# Patient Record
Sex: Male | Born: 1985 | Race: Black or African American | Hispanic: No | Marital: Married | Smoking: Current every day smoker
Health system: Southern US, Community
[De-identification: ages and names within clinical notes are randomized; demographics above are authoritative.]

## PROBLEM LIST (undated history)

## (undated) DIAGNOSIS — I10 Essential (primary) hypertension: Principal | ICD-10-CM

## (undated) DIAGNOSIS — K649 Unspecified hemorrhoids: Secondary | ICD-10-CM

## (undated) DIAGNOSIS — K219 Gastro-esophageal reflux disease without esophagitis: Secondary | ICD-10-CM

## (undated) HISTORY — DX: Gastro-esophageal reflux disease without esophagitis: K21.9

## (undated) HISTORY — PX: HEMORRHOID BANDING: SHX5850

## (undated) HISTORY — DX: Essential (primary) hypertension: I10

## (undated) HISTORY — DX: Unspecified hemorrhoids: K64.9

---

## 2009-10-13 ENCOUNTER — Emergency Department (HOSPITAL_COMMUNITY): Admission: EM | Admit: 2009-10-13 | Discharge: 2009-10-14 | Payer: Self-pay | Admitting: Emergency Medicine

## 2010-06-24 ENCOUNTER — Emergency Department (HOSPITAL_COMMUNITY)
Admission: EM | Admit: 2010-06-24 | Discharge: 2010-06-24 | Disposition: A | Discharge status: Home / Self Care | Payer: Medicaid Other | Attending: Emergency Medicine | Admitting: Emergency Medicine

## 2010-06-24 ENCOUNTER — Emergency Department (HOSPITAL_COMMUNITY): Payer: Medicaid Other

## 2010-06-24 DIAGNOSIS — K219 Gastro-esophageal reflux disease without esophagitis: Secondary | ICD-10-CM | POA: Insufficient documentation

## 2010-06-24 DIAGNOSIS — R072 Precordial pain: Secondary | ICD-10-CM | POA: Insufficient documentation

## 2010-06-24 LAB — POCT I-STAT, CHEM 8
BUN: 14 mg/dL (ref 6–23)
Calcium, Ion: 1.26 mmol/L (ref 1.12–1.32)
Chloride: 104 mEq/L (ref 96–112)
Creatinine, Ser: 1.2 mg/dL (ref 0.4–1.5)
Glucose, Bld: 89 mg/dL (ref 70–99)
HCT: 46 % (ref 39.0–52.0)
Hemoglobin: 15.6 g/dL (ref 13.0–17.0)
Potassium: 4.5 mEq/L (ref 3.5–5.1)
Sodium: 140 mEq/L (ref 135–145)
TCO2: 27 mmol/L (ref 0–100)

## 2010-06-24 LAB — POCT CARDIAC MARKERS
CKMB, poc: <1 ng/mL — ABNORMAL LOW (ref 1.0–8.0)
Myoglobin, poc: 42.5 ng/mL (ref 12–200)
Troponin i, poc: <0.05 ng/mL (ref 0.00–0.09)

## 2010-06-24 LAB — DIFFERENTIAL
Basophils Absolute: 0 10*3/uL (ref 0.0–0.1)
Basophils Relative: 1 % (ref 0–1)
Eosinophils Absolute: 0.2 10*3/uL (ref 0.0–0.7)
Eosinophils Relative: 3 % (ref 0–5)
Lymphocytes Relative: 51 % — ABNORMAL HIGH (ref 12–46)
Lymphs Abs: 3 10*3/uL (ref 0.7–4.0)
Monocytes Absolute: 0.4 10*3/uL (ref 0.1–1.0)
Monocytes Relative: 6 % (ref 3–12)
Neutro Abs: 2.3 10*3/uL (ref 1.7–7.7)
Neutrophils Relative %: 39 % — ABNORMAL LOW (ref 43–77)

## 2010-06-24 LAB — RAPID URINE DRUG SCREEN, HOSP PERFORMED
Amphetamines: NOT DETECTED
Barbiturates: NOT DETECTED
Benzodiazepines: NOT DETECTED
Cocaine: NOT DETECTED
Opiates: NOT DETECTED
Tetrahydrocannabinol: POSITIVE — AB

## 2010-06-24 LAB — CBC
HCT: 43.9 % (ref 39.0–52.0)
Hemoglobin: 15.2 g/dL (ref 13.0–17.0)
MCH: 28.1 pg (ref 26.0–34.0)
MCHC: 34.6 g/dL (ref 30.0–36.0)
MCV: 81.3 fL (ref 78.0–100.0)
Platelets: 188 10*3/uL (ref 150–400)
RBC: 5.4 MIL/uL (ref 4.22–5.81)
RDW: 13.6 % (ref 11.5–15.5)
WBC: 5.8 10*3/uL (ref 4.0–10.5)

## 2014-11-02 ENCOUNTER — Ambulatory Visit (INDEPENDENT_AMBULATORY_CARE_PROVIDER_SITE_OTHER): Payer: BLUE CROSS/BLUE SHIELD | Admitting: Physician Assistant

## 2014-11-02 VITALS — BP 140/72 | HR 100 | Temp 99.0°F | Ht 74.0 in | Wt 156.5 lb

## 2014-11-02 DIAGNOSIS — R369 Urethral discharge, unspecified: Secondary | ICD-10-CM

## 2014-11-02 DIAGNOSIS — IMO0001 Reserved for inherently not codable concepts without codable children: Secondary | ICD-10-CM

## 2014-11-02 DIAGNOSIS — R030 Elevated blood-pressure reading, without diagnosis of hypertension: Secondary | ICD-10-CM

## 2014-11-02 LAB — POCT URINALYSIS DIPSTICK
Bilirubin, UA: NEGATIVE
Glucose, UA: NEGATIVE
Ketones, UA: NEGATIVE
Leukocytes, UA: NEGATIVE
Nitrite, UA: NEGATIVE
Protein, UA: NEGATIVE
Spec Grav, UA: 1.015
Urobilinogen, UA: 0.2
pH, UA: 6

## 2014-11-02 LAB — POCT UA - MICROSCOPIC ONLY
Casts, Ur, LPF, POC: NEGATIVE
Crystals, Ur, HPF, POC: NEGATIVE
Mucus, UA: NEGATIVE
Yeast, UA: NEGATIVE

## 2014-11-02 MED ORDER — AZITHROMYCIN 500 MG PO TABS: ORAL_TABLET | ORAL | Status: DC

## 2014-11-02 MED ORDER — CEFTRIAXONE SODIUM 1 G IJ SOLR
250.0000 mg | Freq: Once | INTRAMUSCULAR | Status: AC
  Administered 2014-11-02: 250 mg via INTRAMUSCULAR
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
## 2014-11-02 NOTE — Patient Instructions (Signed)
Take 2 tabs of azithromycin at once. Have your partner get tested and treated. No intercourse for 2 weeks. I will call you with your lab tests. Return if symptoms do not improve in 1 week.

## 2014-11-03 ENCOUNTER — Telehealth: Payer: Self-pay

## 2014-11-03 ENCOUNTER — Other Ambulatory Visit: Payer: Self-pay | Admitting: Physician Assistant

## 2014-11-04 ENCOUNTER — Telehealth: Payer: Self-pay

## 2014-11-04 LAB — GC/CHLAMYDIA PROBE AMP
CT Probe RNA: NEGATIVE
GC Probe RNA: NEGATIVE

## 2014-11-04 NOTE — Telephone Encounter (Signed)
[facility] a lab order for micro plasma and they no longer do it.

## 2014-11-05 ENCOUNTER — Telehealth: Payer: Self-pay | Admitting: Family Medicine

## 2014-11-05 LAB — TRICHOMONAS VAGINALIS, PROBE AMP: T vaginalis RNA: NEGATIVE

## 2014-11-05 NOTE — Telephone Encounter (Signed)
Patient called about labs i advised him that he didn't have STD plus you haven't looked at labs yet

## 2014-11-08 ENCOUNTER — Telehealth: Payer: Self-pay | Admitting: Family Medicine

## 2014-11-08 DIAGNOSIS — R369 Urethral discharge, unspecified: Secondary | ICD-10-CM

## 2014-11-08 MED ORDER — AZITHROMYCIN 500 MG PO TABS: ORAL_TABLET | ORAL | Status: DC

## 2014-11-08 NOTE — Telephone Encounter (Signed)
Left VM letting him know lab results negative. If he has questions, he can call back.

## 2014-11-08 NOTE — Telephone Encounter (Signed)
Spoke with patient gave him his results again he states that hes still having discharge but he told me he threw medicine up i told him that's the reason why so Jordan Walker could you please seen patient in another RX

## 2014-11-08 NOTE — Addendum Note (Signed)
Addended by: Dorna LeitzBUSH, NICOLE V on: 11/08/2014 02:36 PM   Modules accepted: Orders

## 2014-11-08 NOTE — Telephone Encounter (Addendum)
Rx sent to pharmacy since he threw up pills 20 minutes after taking pills. Still with some penile irritation. No longer having discharge. Return if symptoms persist.

## 2014-11-22 ENCOUNTER — Ambulatory Visit: Payer: Self-pay | Admitting: Family

## 2015-05-03 ENCOUNTER — Ambulatory Visit (INDEPENDENT_AMBULATORY_CARE_PROVIDER_SITE_OTHER): Payer: BLUE CROSS/BLUE SHIELD

## 2015-05-03 ENCOUNTER — Ambulatory Visit (INDEPENDENT_AMBULATORY_CARE_PROVIDER_SITE_OTHER): Payer: BLUE CROSS/BLUE SHIELD | Admitting: Family Medicine

## 2015-05-03 VITALS — BP 132/70 | HR 85 | Temp 98.9°F | Resp 20 | Ht 75.0 in | Wt 156.6 lb

## 2015-05-03 DIAGNOSIS — M545 Low back pain, unspecified: Secondary | ICD-10-CM

## 2015-05-03 DIAGNOSIS — M79604 Pain in right leg: Secondary | ICD-10-CM | POA: Diagnosis not present

## 2015-05-03 DIAGNOSIS — T148XXA Other injury of unspecified body region, initial encounter: Secondary | ICD-10-CM

## 2015-05-03 DIAGNOSIS — M4120 Other idiopathic scoliosis, site unspecified: Secondary | ICD-10-CM | POA: Diagnosis not present

## 2015-05-03 MED ORDER — NAPROXEN 500 MG PO TABS: 500.0000 mg | ORAL_TABLET | Freq: Two times a day (BID) | ORAL | Status: DC

## 2015-05-03 MED ORDER — CYCLOBENZAPRINE HCL 10 MG PO TABS: ORAL_TABLET | ORAL | Status: DC
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
## 2015-05-03 NOTE — Patient Instructions (Signed)
Acute Compartment Syndrome Compartment syndrome is a painful condition that occurs when swelling and pressure build up in a body space (compartment) of the arms or legs. Groups of muscles, nerves, and blood vessels in the arms and legs are separated into various compartments. Each compartment is surrounded by tough layers of tissue called fascia. In compartment syndrome, pressure builds up within the layers of fascia and begins to push on the structures within that compartment.  In acute compartment syndrome, the pressure builds up suddenly, often as the result of an injury. This is a surgical emergency. When a muscle in the compartment moves, you may feel severe pain. If pressure continues to increase, it can block the flow of blood in the smallest blood vessels (capillaries). Then, the nerves and muscles in the compartment cannot get enough oxygen and nutrients (substances needed for survival). They will start to die within 4-8 hours. That is why the pressure needs to be relieved immediately. Identifying the condition early and treating it quickly can prevent most problems. CAUSES  Various things can lead to compartment syndrome. Possible causes include:   Injury. Some injuries can cause swelling or bleeding in a compartment. This can lead to compartment syndrome. Injuries that may cause this problem include:  Broken bones, especially the long bones of the arms and legs.  Crushing injuries.  Penetrating injuries, such as a knife wound that punctures the skin and tissue underneath.  Badly bruised muscles.  Poisonous bites, such as a snake bite.  Severe burns.  Blocked blood flow. This could result from:  A cast or bandage that is too tight.  A surgical procedure. Blood flow sometimes has to be stopped for a while during a surgery, usually with a tourniquet.  Lying for too long in a position that restricts blood flow. This can happen in people who have nerve damage or if a person is  unconscious for a long time.  Drugs used to build up muscles (anabolic steroids).  Drugs that keep the blood from forming clots (blood thinners). SIGNS AND SYMPTOMS  The most common symptom of compartment syndrome is pain. The pain may:   Get worse when moving or stretching the affected body part.  Be more severe than it should be for an injury.  Come along with a feeling of tingling or burning.  Become worse when the area is pushed or squeezed.  Be unaffected by pain medicine. Other symptoms include:   A feeling of tightness or fullness in the affected area.   A loss of feeling.  Weakness in the area.  Loss of movement.  Skin becoming pale, tight, and shiny over the painful area.  DIAGNOSIS  Your health care provider may suspect the problem based on how you describe the pain. The diagnosis is made by using a special device that measures the pressure in the affected area. Blood tests, X-rays, or an ultrasound exam may be done to help rule out other problems.  TREATMENT  Compartment syndrome is a surgical emergency. It should be treated very quickly.   First-aid treatment is given first. This may include:  Promptly treating an injury.  Loosening or removing any cast, bandage, or external wrap that may be causing pain.  Raising the painful arm or leg to the same level as the heart.  Giving oxygen.  Giving fluid through an IV access tube that is put into a vein in the hand or arm.  Surgery (fasciotomy) is needed to relieve the pressure and help prevent permanent  damage. In this surgery, cuts (incisions) are made through the fascia to relieve the pressure in the compartment.   This information is not intended to replace advice given to you by your health care provider. Make sure you discuss any questions you have with your health care provider.   Document Released: 03/28/2009 Document Revised: 12/10/2012 Document Reviewed: 11/11/2012 Elsevier Interactive Patient  Education [employer]2016 Elsevier Inc.

## 2016-04-10 ENCOUNTER — Encounter: Payer: Self-pay | Admitting: Family

## 2016-04-10 ENCOUNTER — Ambulatory Visit (INDEPENDENT_AMBULATORY_CARE_PROVIDER_SITE_OTHER): Payer: BLUE CROSS/BLUE SHIELD | Admitting: Family

## 2016-04-10 VITALS — BP 128/90 | HR 75 | Temp 98.1°F | Resp 16 | Ht 75.0 in | Wt 145.0 lb

## 2016-04-10 DIAGNOSIS — Z23 Encounter for immunization: Secondary | ICD-10-CM | POA: Diagnosis not present

## 2016-04-10 DIAGNOSIS — Z0000 Encounter for general adult medical examination without abnormal findings: Secondary | ICD-10-CM

## 2016-04-10 NOTE — Progress Notes (Signed)
Subjective:    Patient ID: Jordan Walker, male    DOB: 1985-05-18, 30 y.o.   MRN: [id number]  Chief Complaint  Patient presents with  . CPE    not fasting    HPI:  Jordan Walker is a [age] y.o. male who presents today for an annual wellness visit.   1) Health Maintenance -   Diet - Averages about 2-3 meals per day consisting of a regular diet; Caffeine intake of 2-3 cups per day  Exercise - No structured exercise   2) Preventative Exams / Immunizations:  Dental -- Due for exam  Vision -- Due for exam   Health Maintenance  Topic Date Due  . HIV Screening  08/10/1998  . TETANUS/TDAP  08/10/2002  . INFLUENZA VACCINE  07/21/2016 (Originally 11/22/2015)    Immunization History  Administered Date(s) Administered  . Tdap 04/10/2016     No Known Allergies   Outpatient Medications Prior to Visit  Medication Sig Dispense Refill  . cyclobenzaprine (FLEXERIL) 10 MG tablet 1/2-1 tab po BID prn 30 tablet 0  . naproxen (NAPROSYN) 500 MG tablet Take 1 tablet (500 mg total) by mouth 2 (two) times daily with a meal. Take with food, No NSAIDs 30 tablet 0   No facility-administered medications prior to visit.      Past Medical History:  Diagnosis Date  . Hemorrhoids      Past Surgical History:  Procedure Laterality Date  . HEMORRHOID BANDING       Family History  Problem Relation Age of Onset  . Diabetes Mother   . Hypertension Sister   . Hypertension Maternal Grandmother      Social History   Social History  . Marital status: Married    Spouse name: N/A  . Number of children: 3  . Years of education: 5013   Occupational History  . Camera operatorAssistant Associate    Social History Main Topics  . Smoking status: Current Every Day Smoker    Packs/day: 2.00    Years: 12.00    Types: Cigars, Cigarettes  . Smokeless tobacco: Never Used  . Alcohol use 0.6 oz/week    1 Cans of beer per week     Comment: Rarely  . Drug use:     Frequency: 7.0 times per week    Types:  Marijuana  . Sexual activity: Not on file   Other Topics Concern  . Not on file   Social History Narrative  . No narrative on file    Review of Systems  Constitutional: Denies fever, chills, fatigue, or significant weight gain/loss. HENT: Head: Denies headache or neck pain Ears: Denies changes in hearing, ringing in ears, earache, drainage Nose: Denies discharge, stuffiness, itching, nosebleed, sinus pain Throat: Denies sore throat, hoarseness, dry mouth, sores, thrush Eyes: Denies loss/changes in vision, pain, redness, blurry/double vision, flashing lights Cardiovascular: Denies chest pain/discomfort, tightness, palpitations, shortness of breath with activity, difficulty lying down, swelling, sudden awakening with shortness of breath Respiratory: Denies shortness of breath, cough, sputum production, wheezing Gastrointestinal: Denies dysphasia, heartburn, change in appetite, nausea, change in bowel habits, rectal bleeding, constipation, diarrhea, yellow skin or eyes Genitourinary: Denies frequency, urgency, burning/pain, blood in urine, incontinence, change in urinary strength. Musculoskeletal: Denies muscle/joint pain, stiffness, back pain, redness or swelling of joints, trauma Skin: Denies rashes, lumps, itching, dryness, color changes, or hair/nail changes Neurological: Denies dizziness, fainting, seizures, weakness, numbness, tingling, tremor Psychiatric - Denies nervousness, stress, depression or memory loss Endocrine: Denies heat or cold intolerance, sweating,  frequent urination, excessive thirst, changes in appetite Hematologic: Denies ease of bruising or bleeding     Objective:     BP 128/90 (BP Location: Left Arm, Patient Position: Sitting, Cuff Size: Normal)   Pulse 75   Temp 98.1 F (36.7 C) (Oral)   Resp 16   Ht 6\' 3"  (1.905 m)   Wt 145 lb (65.8 kg)   SpO2 98%   BMI 18.12 kg/m  Nursing note and vital signs reviewed.  Physical Exam  Constitutional: He is  oriented to person, place, and time. He appears well-developed and well-nourished.  HENT:  Head: Normocephalic.  Right Ear: Hearing, tympanic membrane, external ear and ear canal normal.  Left Ear: Hearing, tympanic membrane, external ear and ear canal normal.  Nose: Nose normal.  Mouth/Throat: Uvula is midline, oropharynx is clear and moist and mucous membranes are normal.  Eyes: Conjunctivae and EOM are normal. Pupils are equal, round, and reactive to light.  Neck: Neck supple. No JVD present. No tracheal deviation present. No thyromegaly present.  Cardiovascular: Normal rate, regular rhythm, normal heart sounds and intact distal pulses.   Pulmonary/Chest: Effort normal and breath sounds normal.  Abdominal: Soft. Bowel sounds are normal. He exhibits no distension and no mass. There is no tenderness. There is no rebound and no guarding.  Musculoskeletal: Normal range of motion. He exhibits no edema or tenderness.  Lymphadenopathy:    He has no cervical adenopathy.  Neurological: He is alert and oriented to person, place, and time. He has normal reflexes. No cranial nerve deficit. He exhibits normal muscle tone. Coordination normal.  Skin: Skin is warm and dry.  Psychiatric: He has a normal mood and affect. His behavior is normal. Judgment and thought content normal.       Assessment & Plan:   Problem List Items Addressed This Visit      Other   Routine general medical examination at a health care facility    1) Anticipatory Guidance: Discussed importance of wearing a seatbelt while driving and not texting while driving; changing batteries in smoke detector at least once annually; wearing suntan lotion when outside; eating a balanced and moderate diet; getting physical activity at least 30 minutes per day.  2) Immunizations / Screenings / Labs:  Tetanus updated today. Declines influenza. All other immunizations are up-to-date per recommendations. Due for dental and vision screen  encouraged to be completed independently. All other screenings are up-to-date per recommendations. Obtain CBC, A1c, TSH, CMET, and lipid profile.    Overall well exam with risk factors for cardiovascular disease including tobacco use and sedentary lifestyle. Discussed importance of tobacco cessation for reduce risk of cardiovascular, respiratory, and malignant diseases in the future. Indicates he is considering tobacco cessation with information provided and after visit summary. Recommend increasing physical activity to 30 minutes of moderate level activity daily. He is of good weight. He does have several concerns as he has pains in various aspects of his joints on occasion and is worried for chronic disease. Continue other healthy last behaviors and choices. Follow-up prevention exam in 1 year. Follow-up office visit pending blood work as needed.      Relevant Orders   CBC   Comprehensive metabolic panel   TSH   Lipid panel   Hemoglobin A1c    Other Visit Diagnoses    Need for diphtheria-tetanus-pertussis (Tdap) vaccine    -  Primary   Relevant Orders   Tdap vaccine greater than or equal to 7yo IM (Completed)  I have discontinued Mr. Murdoch cyclobenzaprine and naproxen.   Follow-up: Return if symptoms worsen or fail to improve.   Jeanine Luz, FNP

## 2016-04-10 NOTE — Patient Instructions (Addendum)
Thank you for choosing [facility].  SUMMARY AND INSTRUCTIONS:  Recommend trial of proton pump inhibitor: omeprazole, lansoprazole, esomeprazole.   Try a probiotic.   Labs:  Please stop by the lab on the lower level of the building for your blood work. Your results will be released to MyChart (or called to you) after review, usually within 72 hours after test completion. If any changes need to be made, you will be notified at that same time.  1.) The lab is open from 7:30am to 5:30 pm Monday-Friday 2.) No appointment is necessary 3.) Fasting (if needed) is 6-8 hours after food and drink; black coffee and water are okay   Follow up:  If your symptoms worsen or fail to improve, please contact our office for further instruction, or in case of emergency go directly to the emergency room at the closest medical facility.   Health Maintenance, Male A healthy lifestyle and preventative care can promote health and wellness.  Maintain regular health, dental, and eye exams.  Eat a healthy diet. Foods like vegetables, fruits, whole grains, low-fat dairy products, and lean protein foods contain the nutrients you need and are low in calories. Decrease your intake of foods high in solid fats, added sugars, and salt. Get information about a proper diet from your health care provider, if necessary.  Regular physical exercise is one of the most important things you can do for your health. Most adults should get at least 150 minutes of moderate-intensity exercise (any activity that increases your heart rate and causes you to sweat) each week. In addition, most adults need muscle-strengthening exercises on 2 or more days a week.   Maintain a healthy weight. The body mass index (BMI) is a screening tool to identify possible weight problems. It provides an estimate of body fat based on height and weight. Your health care provider can find your BMI and can help you achieve or maintain a healthy weight.  For males 20 years and older:  A BMI below 18.5 is considered underweight.  A BMI of 18.5 to 24.9 is normal.  A BMI of 25 to 29.9 is considered overweight.  A BMI of 30 and above is considered obese.  Maintain normal blood lipids and cholesterol by exercising and minimizing your intake of saturated fat. Eat a balanced diet with plenty of fruits and vegetables. Blood tests for lipids and cholesterol should begin at age 32 and be repeated every 5 years. If your lipid or cholesterol levels are high, you are over age 32, or you are at high risk for heart disease, you may need your cholesterol levels checked more frequently.Ongoing high lipid and cholesterol levels should be treated with medicines if diet and exercise are not working.  If you smoke, find out from your health care provider how to quit. If you do not use tobacco, do not start.  Lung cancer screening is recommended for adults aged 55-80 years who are at high risk for developing lung cancer because of a history of smoking. A yearly low-dose CT scan of the lungs is recommended for people who have at least a 30-pack-year history of smoking and are current smokers or have quit within the past 15 years. A pack year of smoking is smoking an average of 1 pack of cigarettes a day for 1 year (for example, a 30-pack-year history of smoking could mean smoking 1 pack a day for 30 years or 2 packs a day for 15 years). Yearly screening should continue until  the smoker has stopped smoking for at least 15 years. Yearly screening should be stopped for people who develop a health problem that would prevent them from having lung cancer treatment.  If you choose to drink alcohol, do not have more than 2 drinks per day. One drink is considered to be 12 oz (360 mL) of beer, 5 oz (150 mL) of wine, or 1.5 oz (45 mL) of liquor.  Avoid the use of street drugs. Do not share needles with anyone. Ask for help if you need support or instructions about stopping the use  of drugs.  High blood pressure causes heart disease and increases the risk of stroke. High blood pressure is more likely to develop in:  People who have blood pressure in the end of the normal range (100-139/85-89 mm Hg).  People who are overweight or obese.  People who are African American.  If you are 56-64 years of age, have your blood pressure checked every 3-5 years. If you are 86 years of age or older, have your blood pressure checked every year. You should have your blood pressure measured twice-once when you are at a hospital or clinic, and once when you are not at a hospital or clinic. Record the average of the two measurements. To check your blood pressure when you are not at a hospital or clinic, you can use:  An automated blood pressure machine at a pharmacy.  A home blood pressure monitor.  If you are 67-44 years old, ask your health care provider if you should take aspirin to prevent heart disease.  Diabetes screening involves taking a blood sample to check your fasting blood sugar level. This should be done once every 3 years after age 64 if you are at a normal weight and without risk factors for diabetes. Testing should be considered at a younger age or be carried out more frequently if you are overweight and have at least 1 risk factor for diabetes.  Colorectal cancer can be detected and often prevented. Most routine colorectal cancer screening begins at the age of 42 and continues through age 30. However, your health care provider may recommend screening at an earlier age if you have risk factors for colon cancer. On a yearly basis, your health care provider may provide home test kits to check for hidden blood in the stool. A small camera at the end of a tube may be used to directly examine the colon (sigmoidoscopy or colonoscopy) to detect the earliest forms of colorectal cancer. Talk to your health care provider about this at age 83 when routine screening begins. A direct exam  of the colon should be repeated every 5-10 years through age 22, unless early forms of precancerous polyps or small growths are found.  People who are at an increased risk for hepatitis B should be screened for this virus. You are considered at high risk for hepatitis B if:  You were born in a country where hepatitis B occurs often. Talk with your health care provider about which countries are considered high risk.  Your parents were born in a high-risk country and you have not received a shot to protect against hepatitis B (hepatitis B vaccine).  You have HIV or AIDS.  You use needles to inject street drugs.  You live with, or have sex with, someone who has hepatitis B.  You are a man who has sex with other men (MSM).  You get hemodialysis treatment.  You take certain medicines for conditions  like cancer, organ transplantation, and autoimmune conditions.  Hepatitis C blood testing is recommended for all people born from [birth year] through 1967 and any individual with known risk factors for hepatitis C.  Healthy men should no longer receive prostate-specific antigen (PSA) blood tests as part of routine cancer screening. Talk to your health care provider about prostate cancer screening.  Testicular cancer screening is not recommended for adolescents or adult males who have no symptoms. Screening includes self-exam, a health care provider exam, and other screening tests. Consult with your health care provider about any symptoms you have or any concerns you have about testicular cancer.  Practice safe sex. Use condoms and avoid high-risk sexual practices to reduce the spread of sexually transmitted infections (STIs).  You should be screened for STIs, including gonorrhea and chlamydia if:  You are sexually active and are younger than 24 years.  You are older than 24 years, and your health care provider tells you that you are at risk for this type of infection.  Your sexual activity has  changed since you were last screened, and you are at an increased risk for chlamydia or gonorrhea. Ask your health care provider if you are at risk.  If you are at risk of being infected with HIV, it is recommended that you take a prescription medicine daily to prevent HIV infection. This is called pre-exposure prophylaxis (PrEP). You are considered at risk if:  You are a man who has sex with other men (MSM).  You are a heterosexual man who is sexually active with multiple partners.  You take drugs by injection.  You are sexually active with a partner who has HIV.  Talk with your health care provider about whether you are at high risk of being infected with HIV. If you choose to begin PrEP, you should first be tested for HIV. You should then be tested every 3 months for as long as you are taking PrEP.  Use sunscreen. Apply sunscreen liberally and repeatedly throughout the day. You should seek shade when your shadow is shorter than you. Protect yourself by wearing long sleeves, pants, a wide-brimmed hat, and sunglasses year round whenever you are outdoors.  Tell your health care provider of new moles or changes in moles, especially if there is a change in shape or color. Also, tell your health care provider if a mole is larger than the size of a pencil eraser.  A one-time screening for abdominal aortic aneurysm (AAA) and surgical repair of large AAAs by ultrasound is recommended for men aged 65-75 years who are current or former smokers.  Stay current with your vaccines (immunizations). This information is not intended to replace advice given to you by your health care provider. Make sure you discuss any questions you have with your health care provider. Document Released: 10/06/2007 Document Revised: 04/30/2014 Document Reviewed: 01/11/2015 Elsevier Interactive Patient Education  2017 Elsevier Inc.   Smoking Hazards Smoking cigarettes is extremely bad for your health. Tobacco smoke has  over 200 known poisons in it. It contains the poisonous gases nitrogen oxide and carbon monoxide. There are over 60 chemicals in tobacco smoke that cause cancer. Some of the chemicals found in cigarette smoke include:   Cyanide.   Benzene.   Formaldehyde.   Methanol (wood alcohol).   Acetylene (fuel used in welding torches).   Ammonia.  Even smoking lightly shortens your life expectancy by several years. You can greatly reduce the risk of medical problems for you and your family  by stopping now. Smoking is the most preventable cause of death and disease in our society. Within days of quitting smoking, your circulation improves, you decrease the risk of having a heart attack, and your lung capacity improves. There may be some increased phlegm in the first few days after quitting, and it may take months for your lungs to clear up completely. Quitting for 10 years reduces your risk of developing lung cancer to almost that of a nonsmoker.  WHAT ARE THE RISKS OF SMOKING? Cigarette smokers have an increased risk of many serious medical problems, including:  Lung cancer.   Lung disease (such as pneumonia, bronchitis, and emphysema).   Heart attack and chest pain due to the heart not getting enough oxygen (angina).   Heart disease and peripheral blood vessel disease.   Hypertension.   Stroke.   Oral cancer (cancer of the lip, mouth, or voice box).   Bladder cancer.   Pancreatic cancer.   Cervical cancer.   Pregnancy complications, including premature birth.   Stillbirths and smaller newborn babies, birth defects, and genetic damage to sperm.   Early menopause.   Lower estrogen level for women.   Infertility.   Facial wrinkles.   Blindness.   Increased risk of broken bones (fractures).   Senile dementia.   Stomach ulcers and internal bleeding.   Delayed wound healing and increased risk of complications during surgery. Because of secondhand  smoke exposure, children of smokers have an increased risk of the following:   Sudden infant death syndrome (SIDS).   Respiratory infections.   Lung cancer.   Heart disease.   Ear infections.  WHY IS SMOKING ADDICTIVE? Nicotine is the chemical agent in tobacco that is capable of causing addiction or dependence. When you smoke and inhale, nicotine is absorbed rapidly into the bloodstream through your lungs. Both inhaled and noninhaled nicotine may be addictive.  WHAT ARE THE BENEFITS OF QUITTING?  There are many health benefits to quitting smoking. Some are:   The likelihood of developing cancer and heart disease decreases. Health improvements are seen almost immediately.   Blood pressure, pulse rate, and breathing patterns start returning to normal soon after quitting.   People who quit may see an improvement in their overall quality of life.  HOW DO YOU QUIT SMOKING? Smoking is an addiction with both physical and psychological effects, and longtime habits can be hard to change. Your health care provider can recommend:  Programs and community resources, which may include group support, education, or therapy.  Replacement products, such as patches, gum, and nasal sprays. Use these products only as directed. Do not replace cigarette smoking with electronic cigarettes (commonly called e-cigarettes). The safety of e-cigarettes is unknown, and some may contain harmful chemicals. FOR MORE INFORMATION  American Lung Association: www.lung.org  American Cancer Society: www.cancer.org   This information is not intended to replace advice given to you by your health care provider. Make sure you discuss any questions you have with your health care provider.   Document Released: 05/17/2004 Document Revised: 01/28/2013 Document Reviewed: 09/29/2012 Elsevier Interactive Patient Education 2016 [employer].  Steps to Quit Smoking  Smoking tobacco can be harmful to your health and can  affect almost every organ in your body. Smoking puts you, and those around you, at risk for developing many serious chronic diseases. Quitting smoking is difficult, but it is one of the best things that you can do for your health. It is never too late to quit. WHAT ARE THE  BENEFITS OF QUITTING SMOKING? When you quit smoking, you lower your risk of developing serious diseases and conditions, such as:  Lung cancer or lung disease, such as COPD.  Heart disease.  Stroke.  Heart attack.  Infertility.  Osteoporosis and bone fractures. Additionally, symptoms such as coughing, wheezing, and shortness of breath may get better when you quit. You may also find that you get sick less often because your body is stronger at fighting off colds and infections. If you are pregnant, quitting smoking can help to reduce your chances of having a baby of low birth weight. HOW DO I GET READY TO QUIT? When you decide to quit smoking, create a plan to make sure that you are successful. Before you quit:  Pick a date to quit. Set a date within the next two weeks to give you time to prepare.  Write down the reasons why you are quitting. Keep this list in places where you will see it often, such as on your bathroom mirror or in your car or wallet.  Identify the people, places, things, and activities that make you want to smoke (triggers) and avoid them. Make sure to take these actions:  Throw away all cigarettes at home, at work, and in your car.  Throw away smoking accessories, such as Set designer.  Clean your car and make sure to empty the ashtray.  Clean your home, including curtains and carpets.  Tell your family, friends, and coworkers that you are quitting. Support from your loved ones can make quitting easier.  Talk with your health care provider about your options for quitting smoking.  Find out what treatment options are covered by your health insurance. WHAT STRATEGIES CAN I USE TO QUIT  SMOKING?  Talk with your healthcare provider about different strategies to quit smoking. Some strategies include:  Quitting smoking altogether instead of gradually lessening how much you smoke over a period of time. Research shows that quitting "cold [place]" is more successful than gradually quitting.  Attending in-person counseling to help you build problem-solving skills. You are more likely to have success in quitting if you attend several counseling sessions. Even short sessions of 10 minutes can be effective.  Finding resources and support systems that can help you to quit smoking and remain smoke-free after you quit. These resources are most helpful when you use them often. They can include:  Online chats with a Veterinary surgeon.  Telephone quitlines.  Printed Materials engineer.  Support groups or group counseling.  Text messaging programs.  Mobile phone applications.  Taking medicines to help you quit smoking. (If you are pregnant or breastfeeding, talk with your health care provider first.) Some medicines contain nicotine and some do not. Both types of medicines help with cravings, but the medicines that include nicotine help to relieve withdrawal symptoms. Your health care provider may recommend:  Nicotine patches, gum, or lozenges.  Nicotine inhalers or sprays.  Non-nicotine medicine that is taken by mouth. Talk with your health care provider about combining strategies, such as taking medicines while you are also receiving in-person counseling. Using these two strategies together makes you more likely to succeed in quitting than if you used either strategy on its own. If you are pregnant or breastfeeding, talk with your health care provider about finding counseling or other support strategies to quit smoking. Do not take medicine to help you quit smoking unless told to do so by your health care provider. WHAT THINGS CAN I DO TO MAKE IT EASIER  TO QUIT? Quitting smoking might feel  overwhelming at first, but there is a lot that you can do to make it easier. Take these important actions:  Reach out to your family and friends and ask that they support and encourage you during this time. Call telephone quitlines, reach out to support groups, or work with a counselor for support.  Ask people who smoke to avoid smoking around you.  Avoid places that trigger you to smoke, such as bars, parties, or smoke-break areas at work.  Spend time around people who do not smoke.  Lessen stress in your life, because stress can be a smoking trigger for some people. To lessen stress, try:  Exercising regularly.  Deep-breathing exercises.  Yoga.  Meditating.  Performing a body scan. This involves closing your eyes, scanning your body from head to toe, and noticing which parts of your body are particularly tense. Purposefully relax the muscles in those areas.  Download or purchase mobile phone or tablet apps (applications) that can help you stick to your quit plan by providing reminders, tips, and encouragement. There are many free apps, such as QuitGuide from the [employer] developer(Centers for Disease Control and Prevention). You can find other support for quitting smoking (smoking cessation) through smokefree.gov and other websites. HOW WILL I FEEL WHEN I QUIT SMOKING? Within the first 24 hours of quitting smoking, you may start to feel some withdrawal symptoms. These symptoms are usually most noticeable 2-3 days after quitting, but they usually do not last beyond 2-3 weeks. Changes or symptoms that you might experience include:  Mood swings.  Restlessness, anxiety, or irritation.  Difficulty concentrating.  Dizziness.  Strong cravings for sugary foods in addition to nicotine.  Mild weight gain.  Constipation.  Nausea.  Coughing or a sore throat.  Changes in how your medicines work in your body.  A depressed mood.  Difficulty sleeping (insomnia). After the first 2-3 weeks of  quitting, you may start to notice more positive results, such as:  Improved sense of smell and taste.  Decreased coughing and sore throat.  Slower heart rate.  Lower blood pressure.  Clearer skin.  The ability to breathe more easily.  Fewer sick days. Quitting smoking is very challenging for most people. Do not get discouraged if you are not successful the first time. Some people need to make many attempts to quit before they achieve long-term success. Do your best to stick to your quit plan, and talk with your health care provider if you have any questions or concerns.   This information is not intended to replace advice given to you by your health care provider. Make sure you discuss any questions you have with your health care provider.   Document Released: 04/03/2001 Document Revised: 08/24/2014 Document Reviewed: 08/24/2014 Elsevier Interactive Patient Education 2016 [employer]  Smoking Cessation, Tips for Success If you are ready to quit smoking, congratulations! You have chosen to help yourself be healthier. Cigarettes bring nicotine, tar, carbon monoxide, and other irritants into your body. Your lungs, heart, and blood vessels will be able to work better without these poisons. There are many different ways to quit smoking. Nicotine gum, nicotine patches, a nicotine inhaler, or nicotine nasal spray can help with physical craving. Hypnosis, support groups, and medicines help break the habit of smoking. WHAT THINGS CAN I DO TO MAKE QUITTING EASIER?  Here are some tips to help you quit for good:  Pick a date when you will quit smoking completely. Tell all of  your friends and family about your plan to quit on that date.  Do not try to slowly cut down on the number of cigarettes you are smoking. Pick a quit date and quit smoking completely starting on that day.  Throw away all cigarettes.   Clean and remove all ashtrays from your home, work, and car.  On a card, write down  your reasons for quitting. Carry the card with you and read it when you get the urge to smoke.  Cleanse your body of nicotine. Drink enough water and fluids to keep your urine clear or pale yellow. Do this after quitting to flush the nicotine from your body.  Learn to predict your moods. Do not let a bad situation be your excuse to have a cigarette. Some situations in your life might tempt you into wanting a cigarette.  Never have "just one" cigarette. It leads to wanting another and another. Remind yourself of your decision to quit.  Change habits associated with smoking. If you smoked while driving or when feeling stressed, try other activities to replace smoking. Stand up when drinking your coffee. Brush your teeth after eating. Sit in a different chair when you read the paper. Avoid alcohol while trying to quit, and try to drink fewer caffeinated beverages. Alcohol and caffeine may urge you to smoke.  Avoid foods and drinks that can trigger a desire to smoke, such as sugary or spicy foods and alcohol.  Ask people who smoke not to smoke around you.  Have something planned to do right after eating or having a cup of coffee. For example, plan to take a walk or exercise.  Try a relaxation exercise to calm you down and decrease your stress. Remember, you may be tense and nervous for the first 2 weeks after you quit, but this will pass.  Find new activities to keep your hands busy. Play with a pen, coin, or rubber band. Doodle or draw things on paper.  Brush your teeth right after eating. This will help cut down on the craving for the taste of tobacco after meals. You can also try mouthwash.   Use oral substitutes in place of cigarettes. Try using lemon drops, carrots, cinnamon sticks, or chewing gum. Keep them handy so they are available when you have the urge to smoke.  When you have the urge to smoke, try deep breathing.  Designate your home as a nonsmoking area.  If you are a heavy  smoker, ask your health care provider about a prescription for nicotine chewing gum. It can ease your withdrawal from nicotine.  Reward yourself. Set aside the cigarette money you save and buy yourself something nice.  Look for support from others. Join a support group or smoking cessation program. Ask someone at home or at work to help you with your plan to quit smoking.  Always ask yourself, "Do I need this cigarette or is this just a reflex?" Tell yourself, "Today, I choose not to smoke," or "I do not want to smoke." You are reminding yourself of your decision to quit.  Do not replace cigarette smoking with electronic cigarettes (commonly called e-cigarettes). The safety of e-cigarettes is unknown, and some may contain harmful chemicals.  If you relapse, do not give up! Plan ahead and think about what you will do the next time you get the urge to smoke. HOW WILL I FEEL WHEN I QUIT SMOKING? You may have symptoms of withdrawal because your body is used to nicotine (the addictive  substance in cigarettes). You may crave cigarettes, be irritable, feel very hungry, cough often, get headaches, or have difficulty concentrating. The withdrawal symptoms are only temporary. They are strongest when you first quit but will go away within 10-14 days. When withdrawal symptoms occur, stay in control. Think about your reasons for quitting. Remind yourself that these are signs that your body is healing and getting used to being without cigarettes. Remember that withdrawal symptoms are easier to treat than the major diseases that smoking can cause.  Even after the withdrawal is over, expect periodic urges to smoke. However, these cravings are generally short lived and will go away whether you smoke or not. Do not smoke! WHAT RESOURCES ARE AVAILABLE TO HELP ME QUIT SMOKING? Your health care provider can direct you to community resources or hospitals for support, which may include:  Group  support.  Education.  Hypnosis.  Therapy.   This information is not intended to replace advice given to you by your health care provider. Make sure you discuss any questions you have with your health care provider.   Document Released: 01/06/2004 Document Revised: 04/30/2014 Document Reviewed: 09/25/2012 Elsevier Interactive Patient Education [employer]

## 2016-04-10 NOTE — Assessment & Plan Note (Signed)
1) Anticipatory Guidance: Discussed importance of wearing a seatbelt while driving and not texting while driving; changing batteries in smoke detector at least once annually; wearing suntan lotion when outside; eating a balanced and moderate diet; getting physical activity at least 30 minutes per day.  2) Immunizations / Screenings / Labs:  Tetanus updated today. Declines influenza. All other immunizations are up-to-date per recommendations. Due for dental and vision screen encouraged to be completed independently. All other screenings are up-to-date per recommendations. Obtain CBC, A1c, TSH, CMET, and lipid profile.    Overall well exam with risk factors for cardiovascular disease including tobacco use and sedentary lifestyle. Discussed importance of tobacco cessation for reduce risk of cardiovascular, respiratory, and malignant diseases in the future. Indicates he is considering tobacco cessation with information provided and after visit summary. Recommend increasing physical activity to 30 minutes of moderate level activity daily. He is of good weight. He does have several concerns as he has pains in various aspects of his joints on occasion and is worried for chronic disease. Continue other healthy last behaviors and choices. Follow-up prevention exam in 1 year. Follow-up office visit pending blood work as needed.

## 2016-04-17 ENCOUNTER — Other Ambulatory Visit (INDEPENDENT_AMBULATORY_CARE_PROVIDER_SITE_OTHER): Payer: BLUE CROSS/BLUE SHIELD

## 2016-04-17 DIAGNOSIS — Z0000 Encounter for general adult medical examination without abnormal findings: Secondary | ICD-10-CM | POA: Diagnosis not present

## 2016-04-17 LAB — COMPREHENSIVE METABOLIC PANEL
ALT: 11 U/L (ref 0–53)
AST: 17 U/L (ref 0–37)
Albumin: 4.8 g/dL (ref 3.5–5.2)
Alkaline Phosphatase: 61 U/L (ref 39–117)
BUN: 17 mg/dL (ref 6–23)
CO2: 28 mEq/L (ref 19–32)
Calcium: 10 mg/dL (ref 8.4–10.5)
Chloride: 105 mEq/L (ref 96–112)
Creatinine, Ser: 1.01 mg/dL (ref 0.40–1.50)
GFR: 109.62 mL/min (ref >60.00)
Glucose, Bld: 91 mg/dL (ref 70–99)
Potassium: 4.1 mEq/L (ref 3.5–5.1)
Sodium: 142 mEq/L (ref 135–145)
Total Bilirubin: 0.5 mg/dL (ref 0.2–1.2)
Total Protein: 7.8 g/dL (ref 6.0–8.3)

## 2016-04-17 LAB — LIPID PANEL
Cholesterol: 126 mg/dL (ref 0–200)
HDL: 50.7 mg/dL (ref >39.00)
LDL Cholesterol: 67 mg/dL (ref 0–99)
NonHDL: 75.16
Total CHOL/HDL Ratio: 2
Triglycerides: 43 mg/dL (ref 0.0–149.0)
VLDL: 8.6 mg/dL (ref 0.0–40.0)

## 2016-04-17 LAB — CBC
HCT: 46.7 % (ref 39.0–52.0)
Hemoglobin: 15.9 g/dL (ref 13.0–17.0)
MCHC: 34 g/dL (ref 30.0–36.0)
MCV: 82.7 fl (ref 78.0–100.0)
Platelets: 171 10*3/uL (ref 150.0–400.0)
RBC: 5.65 Mil/uL (ref 4.22–5.81)
RDW: 14.1 % (ref 11.5–15.5)
WBC: 7 10*3/uL (ref 4.0–10.5)

## 2016-04-17 LAB — HEMOGLOBIN A1C: Hgb A1c MFr Bld: 5.5 % (ref 4.6–6.5)

## 2016-04-17 LAB — TSH: TSH: 1.62 u[IU]/mL (ref 0.35–4.50)

## 2016-04-19 ENCOUNTER — Encounter: Payer: Self-pay | Admitting: Family

## 2016-11-06 ENCOUNTER — Encounter: Payer: Self-pay | Admitting: Family

## 2016-11-06 ENCOUNTER — Ambulatory Visit (INDEPENDENT_AMBULATORY_CARE_PROVIDER_SITE_OTHER): Payer: BLUE CROSS/BLUE SHIELD | Admitting: Family

## 2016-11-06 ENCOUNTER — Other Ambulatory Visit (INDEPENDENT_AMBULATORY_CARE_PROVIDER_SITE_OTHER): Payer: BLUE CROSS/BLUE SHIELD

## 2016-11-06 VITALS — BP 138/88 | HR 80 | Temp 98.4°F | Resp 16 | Ht 75.0 in | Wt 154.8 lb

## 2016-11-06 DIAGNOSIS — K2100 Gastro-esophageal reflux disease with esophagitis, without bleeding: Secondary | ICD-10-CM

## 2016-11-06 DIAGNOSIS — R1030 Lower abdominal pain, unspecified: Secondary | ICD-10-CM

## 2016-11-06 HISTORY — DX: Gastro-esophageal reflux disease with esophagitis, without bleeding: K21.00

## 2016-11-06 LAB — BASIC METABOLIC PANEL
BUN: 14 mg/dL (ref 6–23)
CO2: 28 mEq/L (ref 19–32)
Calcium: 10.4 mg/dL (ref 8.4–10.5)
Chloride: 104 mEq/L (ref 96–112)
Creatinine, Ser: 1.06 mg/dL (ref 0.40–1.50)
GFR: 103.32 mL/min (ref >60.00)
Glucose, Bld: 100 mg/dL — ABNORMAL HIGH (ref 70–99)
Potassium: 4 mEq/L (ref 3.5–5.1)
Sodium: 140 mEq/L (ref 135–145)

## 2016-11-06 MED ORDER — PANTOPRAZOLE SODIUM 40 MG PO TBEC: 40.0000 mg | DELAYED_RELEASE_TABLET | Freq: Every day | ORAL | 2 refills | Status: DC

## 2016-11-06 NOTE — Assessment & Plan Note (Signed)
Chest pain does not appear to be cardiac in nature with previous history of GERD and treated with Nexium. Start pantoprazole. Continue to monitor symptoms.

## 2016-11-06 NOTE — Progress Notes (Signed)
Subjective:    Patient ID: Jordan Walker, male    DOB: Feb 02, 1986, 31 y.o.   MRN: [id number]  Chief Complaint  Patient presents with  . Abdominal Pain    having a burning pain in the lower left side of his stomach, feels a knot on the lower right side of abdomen, and states he feels pressure in stomach    HPI:  Jordan Solomonserry Mellen is a 31 y.o. male who  has a past medical history of Hemorrhoids. and presents today for an office visit.  This is a new problem. Associated symptom of pressure located in his abdomen and chest has been going on for a couple of months with a frequency of about once per month. Also notes abdominal pain located across his lower abdomen on wrapping around both right and left and sides that generally waxes and wanes. No current nausea, vomiting diarrhea or constipation. Eating may make the pains feel a little better but he is not sure. Describes a "lump" located on his right side that he has concerns about. There is some tenderness to the touch. Aggravated with trunk flexion. No drinking a lot of water, but does drink a lot sports drinks. Minimal caffeine and alcohol intake.   No Known Allergies    No outpatient prescriptions prior to visit.   No facility-administered medications prior to visit.       Past Surgical History:  Procedure Laterality Date  . HEMORRHOID BANDING        Past Medical History:  Diagnosis Date  . Hemorrhoids       Review of Systems  Constitutional: Negative for chills and fever.  Respiratory: Negative for chest tightness and shortness of breath.   Cardiovascular: Positive for chest pain.  Gastrointestinal: Positive for abdominal pain and nausea (occasional). Negative for abdominal distention, blood in stool, constipation, diarrhea, rectal pain and vomiting.  Neurological: Negative for dizziness.      Objective:    BP 138/88 (BP Location: Left Arm, Patient Position: Sitting, Cuff Size: Normal)   Pulse 80   Temp 98.4 F (36.9 C)  (Oral)   Resp 16   Ht 6\' 3"  (1.905 m)   Wt 154 lb 12.8 oz (70.2 kg)   SpO2 96%   BMI 19.35 kg/m  Nursing note and vital signs reviewed.  Physical Exam  Constitutional: He is oriented to person, place, and time. He appears well-developed and well-nourished. No distress.  Cardiovascular: Normal rate, regular rhythm, normal heart sounds and intact distal pulses.   Pulmonary/Chest: Effort normal and breath sounds normal.  Abdominal: Normal appearance and bowel sounds are normal. He exhibits no fluid wave. There is no hepatosplenomegaly or hepatomegaly. There is no rigidity, no guarding, no CVA tenderness, no tenderness at McBurney's point and negative Murphy's sign.    Neurological: He is alert and oriented to person, place, and time.  Skin: Skin is warm and dry.  Psychiatric: He has a normal mood and affect. His behavior is normal. Judgment and thought content normal.       Assessment & Plan:   Problem List Items Addressed This Visit      Digestive   Gastroesophageal reflux disease with esophagitis    Chest pain does not appear to be cardiac in nature with previous history of GERD and treated with Nexium. Start pantoprazole. Continue to monitor symptoms.         Other   Lower abdominal pain - Primary    Lower abdominal pain with small questionable mass located  in the lower aspect of the right lower quadrant. Unlikely appendix related. Obtain BMET and CT scan. Continue to monitor pending imaging results.       Relevant Orders   CT Abdomen Pelvis W Contrast   Basic Metabolic Panel (BMET) (Completed)       I am having Mr. Massing start on pantoprazole.   Meds ordered this encounter  Medications  . pantoprazole (PROTONIX) 40 MG tablet    Sig: Take 1 tablet (40 mg total) by mouth daily.    Dispense:  30 tablet    Refill:  2    Order Specific Question:   Supervising Provider    Answer:   Hillard Danker A [[id number]]     Follow-up: Return if symptoms worsen or fail to  improve.   Jeanine Luz, FNP

## 2016-11-06 NOTE — Patient Instructions (Signed)
Thank you for choosing [facility].  SUMMARY AND INSTRUCTIONS:  You will receive a call to schedule your CT scan of your abdomen.  Please stop by the last to check you kidney function.  Start taking the pantoprazole.  Follow up and additional treatment pending imaging and medication.   Medication:  Your prescription(s) have been submitted to your pharmacy or been printed and provided for you. Please take as directed and contact our office if you believe you are having problem(s) with the medication(s) or have any questions.  Follow up:  If your symptoms worsen or fail to improve, please contact our office for further instruction, or in case of emergency go directly to the emergency room at the closest medical facility.

## 2016-11-06 NOTE — Assessment & Plan Note (Signed)
Lower abdominal pain with small questionable mass located in the lower aspect of the right lower quadrant. Unlikely appendix related. Obtain BMET and CT scan. Continue to monitor pending imaging results.

## 2016-11-13 ENCOUNTER — Encounter: Payer: Self-pay | Admitting: Family

## 2016-11-13 ENCOUNTER — Ambulatory Visit
Admission: RE | Admit: 2016-11-13 | Discharge: 2016-11-13 | Disposition: A | Discharge status: Home / Self Care | Payer: BLUE CROSS/BLUE SHIELD | Source: Ambulatory Visit | Attending: Family | Admitting: Family

## 2016-11-13 DIAGNOSIS — R1030 Lower abdominal pain, unspecified: Secondary | ICD-10-CM

## 2016-11-13 MED ORDER — IOPAMIDOL (ISOVUE-300) INJECTION 61%
100.0000 mL | Freq: Once | INTRAVENOUS | Status: AC | PRN
  Administered 2016-11-13: 100 mL via INTRAVENOUS

## 2017-02-22 ENCOUNTER — Other Ambulatory Visit: Payer: Self-pay | Admitting: *Deleted

## 2017-02-22 MED ORDER — PANTOPRAZOLE SODIUM 40 MG PO TBEC: 40.0000 mg | DELAYED_RELEASE_TABLET | Freq: Every day | ORAL | 1 refills | Status: DC

## 2017-02-28 NOTE — Telephone Encounter (Signed)
done

## 2017-07-29 ENCOUNTER — Ambulatory Visit: Payer: Self-pay

## 2017-07-29 ENCOUNTER — Ambulatory Visit: Payer: BLUE CROSS/BLUE SHIELD | Admitting: Family Medicine

## 2017-07-29 ENCOUNTER — Encounter: Payer: Self-pay | Admitting: Family Medicine

## 2017-07-29 VITALS — BP 150/90 | HR 107 | Temp 99.5°F | Wt 170.2 lb

## 2017-07-29 DIAGNOSIS — J019 Acute sinusitis, unspecified: Secondary | ICD-10-CM

## 2017-07-29 DIAGNOSIS — R030 Elevated blood-pressure reading, without diagnosis of hypertension: Secondary | ICD-10-CM | POA: Diagnosis not present

## 2017-07-29 DIAGNOSIS — I10 Essential (primary) hypertension: Secondary | ICD-10-CM

## 2017-07-29 DIAGNOSIS — B9689 Other specified bacterial agents as the cause of diseases classified elsewhere: Secondary | ICD-10-CM | POA: Diagnosis not present

## 2017-07-29 HISTORY — DX: Essential (primary) hypertension: I10

## 2017-07-29 MED ORDER — AMOXICILLIN-POT CLAVULANATE 875-125 MG PO TABS
1.0000 | ORAL_TABLET | Freq: Two times a day (BID) | ORAL | 0 refills | Status: DC
Start: 2017-07-29 — End: 2017-09-05

## 2017-07-29 NOTE — Telephone Encounter (Signed)
Patient has been scheduled

## 2017-07-29 NOTE — Assessment & Plan Note (Signed)
BP elevated, rechecked manually with reading of 136/94.  Will hold off on starting treatment at this time as he has an already scheduled appointment with me next week as well.  We'll plan to recheck at that time.

## 2017-07-29 NOTE — Patient Instructions (Signed)
It was very nice to meet you today, keep your appointment with me for next week.  Start antibiotic and take until complete.    Call if there is any worsening of your symptoms.      Sinusitis, Adult Sinusitis is soreness and inflammation of your sinuses. Sinuses are hollow spaces in the bones around your face. They are located:  Around your eyes.  In the middle of your forehead.  Behind your nose.  In your cheekbones.  Your sinuses and nasal passages are lined with a stringy fluid (mucus). Mucus normally drains out of your sinuses. When your nasal tissues get inflamed or swollen, the mucus can get trapped or blocked so air cannot flow through your sinuses. This lets bacteria, viruses, and funguses grow, and that leads to infection. Follow these instructions at home: Medicines  Take, use, or apply over-the-counter and prescription medicines only as told by your doctor. These may include nasal sprays.  If you were prescribed an antibiotic medicine, take it as told by your doctor. Do not stop taking the antibiotic even if you start to feel better. Hydrate and Humidify  Drink enough water to keep your pee (urine) clear or pale yellow.  Use a cool mist humidifier to keep the humidity level in your home above 50%.  Breathe in steam for 10-15 minutes, 3-4 times a day or as told by your doctor. You can do this in the bathroom while a hot shower is running.  Try not to spend time in cool or dry air. Rest  Rest as much as possible.  Sleep with your head raised (elevated).  Make sure to get enough sleep each night. General instructions  Put a warm, moist washcloth on your face 3-4 times a day or as told by your doctor. This will help with discomfort.  Wash your hands often with soap and water. If there is no soap and water, use hand sanitizer.  Do not smoke. Avoid being around people who are smoking (secondhand smoke).  Keep all follow-up visits as told by your doctor. This is  important. Contact a doctor if:  You have a fever.  Your symptoms get worse.  Your symptoms do not get better within 10 days. Get help right away if:  You have a very bad headache.  You cannot stop throwing up (vomiting).  You have pain or swelling around your face or eyes.  You have trouble seeing.  You feel confused.  Your neck is stiff.  You have trouble breathing. This information is not intended to replace advice given to you by your health care provider. Make sure you discuss any questions you have with your health care provider. Document Released: 09/26/2007 Document Revised: 12/04/2015 Document Reviewed: 02/02/2015 Elsevier Interactive Patient Education  [employer]2018 Else[employer] Inc[employer]

## 2017-07-29 NOTE — Telephone Encounter (Signed)
FYI

## 2017-07-29 NOTE — Progress Notes (Signed)
Subjective:    Patient ID: Jordan Walker, male    DOB: January 28, 1986, 32 y.o.   MRN: [id number]  Chief Complaint  Patient presents with  . Epistaxis     Sinusitis  This is a new problem. The current episode started in the past 7 days. The problem has been waxing and waning since onset. There has been no fever. His pain is at a severity of 4/10. The pain is mild. Associated symptoms include congestion, headaches, a hoarse voice and sinus pressure. Pertinent negatives include no chills, coughing, diaphoresis, ear pain, neck pain, shortness of breath, sneezing, sore throat or swollen glands. (Nasal discharge has been yellow and streaked with blood.  He also denies metallic/salty taste, prior head injury, or snorting or any foreign objects/drugs.  ) Treatments tried: ibuprofen. The treatment provided mild relief.   -Elevated BP:  BP noted to be elevated, no prior hx of htn.  Does report family history of htn.  Denies decongestant use.     Review of Systems  Constitutional: Negative for chills and diaphoresis.  HENT: Positive for congestion, hoarse voice and sinus pressure. Negative for ear pain, sneezing and sore throat.   Respiratory: Negative for cough and shortness of breath.   Musculoskeletal: Negative for neck pain.  Neurological: Positive for headaches.  Additional Ros Per HPI, otherwise negative.   Past Medical History:  Diagnosis Date  . GERD (gastroesophageal reflux disease)   . Hemorrhoids    Past Surgical History:  Procedure Laterality Date  . HEMORRHOID BANDING     Family History  Problem Relation Age of Onset  . Diabetes Mother   . Hypertension Sister   . Hypertension Maternal Grandmother    Social History   Socioeconomic History  . Marital status: Married    Spouse name: Not on file  . Number of children: 3  . Years of education: 5313  . Highest education level: Not on file  Occupational History  . Occupation: Camera operatorAssistant Associate  Social Needs  . Financial  resource strain: Not on file  . Food insecurity:    Worry: Not on file    Inability: Not on file  . Transportation needs:    Medical: Not on file    Non-medical: Not on file  Tobacco Use  . Smoking status: Current Every Day Smoker    Packs/day: 2.00    Years: 12.00    Pack years: 24.00    Types: Cigars, Cigarettes  . Smokeless tobacco: Never Used  Substance and Sexual Activity  . Alcohol use: Yes    Alcohol/week: 0.6 oz    Types: 1 Cans of beer per week    Comment: Rarely  . Drug use: Yes    Frequency: 7.0 times per week    Types: Marijuana  . Sexual activity: Not on file  Lifestyle  . Physical activity:    Days per week: Not on file    Minutes per session: Not on file  . Stress: Not on file  Relationships  . Social connections:    Talks on phone: Not on file    Gets together: Not on file    Attends religious service: Not on file    Active member of club or organization: Not on file    Attends meetings of clubs or organizations: Not on file    Relationship status: Not on file  Other Topics Concern  . Not on file  Social History Narrative  . Not on file   No Known Allergies  Objective:   Physical Exam  Constitutional: He is oriented to person, place, and time. He appears well-nourished. No distress.  HENT:  Head: Normocephalic and atraumatic.  Right Ear: Tympanic membrane, external ear and ear canal normal.  Left Ear: External ear and ear canal normal. Tympanic membrane is not injected, not erythematous, not retracted and not bulging. A middle ear effusion (serous) is present.  Nose: Mucosal edema present. No sinus tenderness or nasal deformity. No epistaxis.  No foreign bodies. Right sinus exhibits no maxillary sinus tenderness and no frontal sinus tenderness. Left sinus exhibits maxillary sinus tenderness. Left sinus exhibits no frontal sinus tenderness.  Mouth/Throat: Uvula is midline. Posterior oropharyngeal erythema present. No oropharyngeal exudate or  posterior oropharyngeal edema.  Eyes: No scleral icterus.  Neck: Neck supple. No thyromegaly present.  Cardiovascular: Normal rate, regular rhythm and normal heart sounds.  Pulmonary/Chest: Effort normal and breath sounds normal. No respiratory distress. He has no wheezes.  Abdominal: Soft. Bowel sounds are normal. He exhibits no distension. There is no tenderness.  Lymphadenopathy:    He has no cervical adenopathy.  Neurological: He is alert and oriented to person, place, and time.  Skin: No rash noted.  Psychiatric: He has a normal mood and affect. His behavior is normal.          Assessment & Plan:  Acute bacterial rhinosinusitis Discussed treatment of sinus infection with antibiotics given appearance of bacterial infection, sent in rx for augmentin.  Instructed to take until complete.  Increase fluid intake, rest as needed.  Call if not improving or for any worsening symptoms.    Elevated blood pressure reading in office without diagnosis of hypertension BP elevated, rechecked manually with reading of 136/94.  Will hold off on starting treatment at this time as he has an already scheduled appointment with me next week as well.  We'll plan to recheck at that time.

## 2017-07-29 NOTE — Assessment & Plan Note (Signed)
Discussed treatment of sinus infection with antibiotics given appearance of bacterial infection, sent in rx for augmentin.  Instructed to take until complete.  Increase fluid intake, rest as needed.  Call if not improving or for any worsening symptoms.

## 2017-07-29 NOTE — Telephone Encounter (Signed)
Pt. Called to report onset of left nostril drainage on 4/4; initial dnge was "yellow".  Described just 3-4 drips and then it would stop.  Reported on Sat. 4/6 approx. 12:00, began having drips of bloody drainage from left nostril.  Stated this reoccurred about 4-5 hrs later; described 3-4 drops of bloody drainage, then stopped.   Stated that the intermittent dripping of red, bloody drainage has occurred about every few hrs., from left nostril.  Denied headache.  C/o intermittent light-headedness since the nasal drainage. Stated the bloody drainage has occurred about 5-6 times in the past 24 hrs.  Denied any bloody drainage currently.  Reported intermittent sharp pain in the left cheek.  Stated right nostril is "stuffy", but able to breathe through both nostrils.  Denied fever/ chills. Reported having had a cold within the past 2 weeks.  Stated his throat feels dry.  Denied cough or chest congestion.  Appt. Sched. today at 2:00 PM with Dr. Everrett Coombeody Matthews.   Pt. Agrees with plan.     Reason for Disposition . [1] Bleeding recurs 3 or more times in 24 hours AND [2] direct pressure applied correctly  Answer Assessment - Initial Assessment Questions 1. AMOUNT OF BLEEDING: "How bad is the bleeding?" "How much blood was lost?" "Has the bleeding stopped?"   - MILD: needed a couple tissues   - MODERATE: needed many tissues   - SEVERE: large blood clots, soaked many tissues, lasted more than 30 minutes     Mild bloody drips from left nostril; 3-4 drips every few hrs., and then stops; last episode bloody drainage right nostril was about 8:30 AM 2. ONSET: "When did the nosebleed start?"      Thursday, 4/4; started with yellow nasal drainage; bleeding started on Saturday noon with drops of blood; then sev. hours later a few drops  3. FREQUENCY: "How many nosebleeds have you had in the last 24 hours?"      About every few hours 4. RECURRENT SYMPTOMS: "Have there been other recent nosebleeds?" If so, ask: "How long did  it take you to stop the bleeding?" "What worked best?"     No 5. CAUSE: "What do you think caused this nosebleed?"    unknown 6. LOCAL FACTORS: "Do you have any cold symptoms?", "Have you been rubbing or picking at your nose?"     Nasal congestion/"stuffy right nostril" , dry throat  7. SYSTEMIC FACTORS: "Do you have high blood pressure or any bleeding problems?"     Denied high blood pressure 8. BLOOD THINNERS: "Do you take any blood thinners?" (e.g., coumadin, heparin, aspirin, Plavix)    Occasional Ibuprofen for headache 9. OTHER SYMPTOMS: "Do you have any other symptoms?" (e.g., lightheadedness)     Denied fever/ chills; c/o intermittent light headedness since blood nasal drainage. Occas. Headache; denied headache now; stuffiness right nostril and clear left nostril; reported able to breathe through both nostrils.  10. PREGNANCY: "Is there any chance you are pregnant?" "When was your last menstrual period?"       n/a  Protocols used: NOSEBLEED-A-AH

## 2017-08-02 ENCOUNTER — Ambulatory Visit: Payer: BLUE CROSS/BLUE SHIELD | Admitting: Family Medicine

## 2017-08-06 ENCOUNTER — Ambulatory Visit (INDEPENDENT_AMBULATORY_CARE_PROVIDER_SITE_OTHER): Payer: BLUE CROSS/BLUE SHIELD | Admitting: Family Medicine

## 2017-08-06 ENCOUNTER — Encounter: Payer: Self-pay | Admitting: Family Medicine

## 2017-08-06 VITALS — BP 140/100 | HR 86 | Ht 75.0 in | Wt 172.0 lb

## 2017-08-06 DIAGNOSIS — F17200 Nicotine dependence, unspecified, uncomplicated: Secondary | ICD-10-CM | POA: Insufficient documentation

## 2017-08-06 DIAGNOSIS — J019 Acute sinusitis, unspecified: Secondary | ICD-10-CM | POA: Diagnosis not present

## 2017-08-06 DIAGNOSIS — F17210 Nicotine dependence, cigarettes, uncomplicated: Secondary | ICD-10-CM | POA: Diagnosis not present

## 2017-08-06 DIAGNOSIS — I10 Essential (primary) hypertension: Secondary | ICD-10-CM

## 2017-08-06 DIAGNOSIS — Z13220 Encounter for screening for lipoid disorders: Secondary | ICD-10-CM

## 2017-08-06 DIAGNOSIS — B9689 Other specified bacterial agents as the cause of diseases classified elsewhere: Secondary | ICD-10-CM | POA: Diagnosis not present

## 2017-08-06 LAB — LIPID PANEL
Cholesterol: 139 mg/dL (ref 0–200)
HDL: 41 mg/dL (ref >39.00)
LDL Cholesterol: 84 mg/dL (ref 0–99)
NonHDL: 97.64
Total CHOL/HDL Ratio: 3
Triglycerides: 67 mg/dL (ref 0.0–149.0)
VLDL: 13.4 mg/dL (ref 0.0–40.0)

## 2017-08-06 LAB — COMPREHENSIVE METABOLIC PANEL
ALT: 16 U/L (ref 0–53)
AST: 17 U/L (ref 0–37)
Albumin: 4.8 g/dL (ref 3.5–5.2)
Alkaline Phosphatase: 71 U/L (ref 39–117)
BUN: 16 mg/dL (ref 6–23)
CO2: 33 mEq/L — ABNORMAL HIGH (ref 19–32)
Calcium: 10.7 mg/dL — ABNORMAL HIGH (ref 8.4–10.5)
Chloride: 107 mEq/L (ref 96–112)
Creatinine, Ser: 0.99 mg/dL (ref 0.40–1.50)
GFR: 111.29 mL/min (ref >60.00)
Glucose, Bld: 96 mg/dL (ref 70–99)
Potassium: 5.5 mEq/L — ABNORMAL HIGH (ref 3.5–5.1)
Sodium: 150 mEq/L — ABNORMAL HIGH (ref 135–145)
Total Bilirubin: 0.3 mg/dL (ref 0.2–1.2)
Total Protein: 7.9 g/dL (ref 6.0–8.3)

## 2017-08-06 LAB — CBC
HCT: 46.4 % (ref 39.0–52.0)
Hemoglobin: 15.7 g/dL (ref 13.0–17.0)
MCHC: 33.8 g/dL (ref 30.0–36.0)
MCV: 83.9 fl (ref 78.0–100.0)
Platelets: 191 10*3/uL (ref 150.0–400.0)
RBC: 5.52 Mil/uL (ref 4.22–5.81)
RDW: 14.1 % (ref 11.5–15.5)
WBC: 6.5 10*3/uL (ref 4.0–10.5)

## 2017-08-06 LAB — TSH: TSH: 1 u[IU]/mL (ref 0.35–4.50)

## 2017-08-06 MED ORDER — HYDROCHLOROTHIAZIDE 12.5 MG PO TABS: 12.5000 mg | ORAL_TABLET | Freq: Every day | ORAL | 3 refills | Status: DC

## 2017-08-06 NOTE — Patient Instructions (Signed)

## 2017-08-06 NOTE — Assessment & Plan Note (Signed)
Improved, complete current antibiotic

## 2017-08-06 NOTE — Assessment & Plan Note (Signed)
BP elevated x2 separate occasions, will start HCTZ 12.5mg  and he will continue to work on sodium reduction.  Stressed importance of quitting smoking as well as this increases his risk of hypertension, heart disease and stroke.  He will return in 4-6 weeks to recheck.

## 2017-08-06 NOTE — Assessment & Plan Note (Signed)
Pre-contemplative stage, not sure he is at a place where he could successfully quit at this time.  Offered counseling and/or medications which he declines at this time.

## 2017-08-06 NOTE — Progress Notes (Signed)
   Subjective:    Patient ID: Jordan Walker, male    DOB: 1985/11/30, 32 y.o.   MRN: [id number]  Chief Complaint  Patient presents with  . Follow-up  . Hypertension     HPI Jordan Walker is a 32 y.o. male here today for follow up of sinusitis and elevated blood pressure.    -Sinusitis:  Reports improvement in symptoms since last visit.  Denies any further drainage or nosebleeds since starting antibiotic.  He has a few days of antibiotics left.  He denies sinus pain, fever, chills, or increased drainage.  He does unfortunately continue to smoke.   -Elevated blood pressure: Blood pressures continue to be elevated.  Has not monitored at home but has tried to reduce sodium intake.  Denies chest pain, shortness of breath, palpitations, headache, vision changes.   Review of Systems ROS per HPI, otherwise negative.     Objective:   Physical Exam  Constitutional: He is oriented to person, place, and time. He appears well-nourished. No distress.  HENT:  Head: Normocephalic and atraumatic.  Mouth/Throat: Oropharynx is clear and moist.  Turbinates are mildly edematous No sinus tenderness   Eyes: No scleral icterus.  Neck: Neck supple. No thyromegaly present.  Cardiovascular: Normal rate, regular rhythm and normal heart sounds.  Pulmonary/Chest: Effort normal and breath sounds normal. No respiratory distress. He has no wheezes.  Lymphadenopathy:    He has no cervical adenopathy.  Neurological: He is alert and oriented to person, place, and time.  Psychiatric: He has a normal mood and affect. His behavior is normal.          Assessment & Plan:  Acute bacterial rhinosinusitis Improved, complete current antibiotic  Essential hypertension BP elevated x2 separate occasions, will start HCTZ 12.5mg  and he will continue to work on sodium reduction.  Stressed importance of quitting smoking as well as this increases his risk of hypertension, heart disease and stroke.  He will return in 4-6 weeks  to recheck.   Nicotine dependence with current use Pre-contemplative stage, not sure he is at a place where he could successfully quit at this time.  Offered counseling and/or medications which he declines at this time.

## 2017-08-07 ENCOUNTER — Other Ambulatory Visit: Payer: Self-pay | Admitting: Family Medicine

## 2017-08-07 ENCOUNTER — Other Ambulatory Visit (INDEPENDENT_AMBULATORY_CARE_PROVIDER_SITE_OTHER): Payer: BLUE CROSS/BLUE SHIELD

## 2017-08-07 DIAGNOSIS — E870 Hyperosmolality and hypernatremia: Secondary | ICD-10-CM

## 2017-08-07 DIAGNOSIS — E875 Hyperkalemia: Secondary | ICD-10-CM

## 2017-08-07 LAB — BASIC METABOLIC PANEL
BUN: 16 mg/dL (ref 6–23)
CO2: 29 mEq/L (ref 19–32)
Calcium: 10.3 mg/dL (ref 8.4–10.5)
Chloride: 101 mEq/L (ref 96–112)
Creatinine, Ser: 0.98 mg/dL (ref 0.40–1.50)
GFR: 112.6 mL/min (ref >60.00)
Glucose, Bld: 116 mg/dL — ABNORMAL HIGH (ref 70–99)
Potassium: 4.2 mEq/L (ref 3.5–5.1)
Sodium: 140 mEq/L (ref 135–145)

## 2017-09-05 ENCOUNTER — Ambulatory Visit: Payer: BLUE CROSS/BLUE SHIELD | Admitting: Family Medicine

## 2017-09-05 ENCOUNTER — Encounter: Payer: Self-pay | Admitting: Family Medicine

## 2017-09-05 VITALS — BP 140/82 | HR 80 | Wt 169.4 lb

## 2017-09-05 DIAGNOSIS — F17200 Nicotine dependence, unspecified, uncomplicated: Secondary | ICD-10-CM | POA: Diagnosis not present

## 2017-09-05 DIAGNOSIS — I10 Essential (primary) hypertension: Secondary | ICD-10-CM

## 2017-09-05 MED ORDER — HYDROCHLOROTHIAZIDE 12.5 MG PO TABS: 12.5000 mg | ORAL_TABLET | Freq: Every day | ORAL | 3 refills | Status: DC

## 2017-09-05 MED ORDER — HYDROCHLOROTHIAZIDE 25 MG PO TABS: 25.0000 mg | ORAL_TABLET | Freq: Every day | ORAL | 2 refills | Status: DC

## 2017-09-05 NOTE — Assessment & Plan Note (Signed)
Blood pressure is not at goal at for age and co-morbidities.  I recommend increase of HCTZ to .  In addition they were instructed to follow a low sodium diet with regular exercise to help to maintain adequate control of blood pressure.

## 2017-09-05 NOTE — Assessment & Plan Note (Signed)
Quit smoking x1 week, congratulated.  Offered resources to help with continuation of cessation.

## 2017-09-05 NOTE — Patient Instructions (Signed)
Managing Your Hypertension Hypertension is commonly called high blood pressure. This is when the force of your blood pressing against the walls of your arteries is too strong. Arteries are blood vessels that carry blood from your heart throughout your body. Hypertension forces the heart to work harder to pump blood, and may cause the arteries to become narrow or stiff. Having untreated or uncontrolled hypertension can cause heart attack, stroke, kidney disease, and other problems. What are blood pressure readings? A blood pressure reading consists of a higher number over a lower number. Ideally, your blood pressure should be below 120/80. The first ("top") number is called the systolic pressure. It is a measure of the pressure in your arteries as your heart beats. The second ("bottom") number is called the diastolic pressure. It is a measure of the pressure in your arteries as the heart relaxes. What does my blood pressure reading mean? Blood pressure is classified into four stages. Based on your blood pressure reading, your health care provider may use the following stages to determine what type of treatment you need, if any. Systolic pressure and diastolic pressure are measured in a unit called mm Hg. Normal  Systolic pressure: below 120.  Diastolic pressure: below 80. Elevated  Systolic pressure: 120-129.  Diastolic pressure: below 80. Hypertension stage 1  Systolic pressure: 130-139.  Diastolic pressure: 80-89. Hypertension stage 2  Systolic pressure: 140 or above.  Diastolic pressure: 90 or above. What health risks are associated with hypertension? Managing your hypertension is an important responsibility. Uncontrolled hypertension can lead to:  A heart attack.  A stroke.  A weakened blood vessel (aneurysm).  Heart failure.  Kidney damage.  Eye damage.  Metabolic syndrome.  Memory and concentration problems.  What changes can I make to manage my  hypertension? Hypertension can be managed by making lifestyle changes and possibly by taking medicines. Your health care provider will help you make a plan to bring your blood pressure within a normal range. Eating and drinking  Eat a diet that is high in fiber and potassium, and low in salt (sodium), added sugar, and fat. An example eating plan is called the DASH (Dietary Approaches to Stop Hypertension) diet. To eat this way: ? Eat plenty of fresh fruits and vegetables. Try to fill half of your plate at each meal with fruits and vegetables. ? Eat whole grains, such as whole wheat pasta, brown rice, or whole grain bread. Fill about one quarter of your plate with whole grains. ? Eat low-fat diary products. ? Avoid fatty cuts of meat, processed or cured meats, and poultry with skin. Fill about one quarter of your plate with lean proteins such as fish, chicken without skin, beans, eggs, and tofu. ? Avoid premade and processed foods. These tend to be higher in sodium, added sugar, and fat.  Reduce your daily sodium intake. Most people with hypertension should eat less than 1,500 mg of sodium a day.  Limit alcohol intake to no more than 1 drink a day for nonpregnant women and 2 drinks a day for men. One drink equals 12 oz of beer, 5 oz of wine, or 1 oz of hard liquor. Lifestyle  Work with your health care provider to maintain a healthy body weight, or to lose weight. Ask what an ideal weight is for you.  Get at least 30 minutes of exercise that causes your heart to beat faster (aerobic exercise) most days of the week. Activities may include walking, swimming, or biking.  Include exercise   to strengthen your muscles (resistance exercise), such as weight lifting, as part of your weekly exercise routine. Try to do these types of exercises for 30 minutes at least 3 days a week.  Do not use any products that contain nicotine or tobacco, such as cigarettes and e-cigarettes. If you need help quitting, ask  your health care provider.  Control any long-term (chronic) conditions you have, such as high cholesterol or diabetes. Monitoring  Monitor your blood pressure at home as told by your health care provider. Your personal target blood pressure may vary depending on your medical conditions, your age, and other factors.  Have your blood pressure checked regularly, as often as told by your health care provider. Working with your health care provider  Review all the medicines you take with your health care provider because there may be side effects or interactions.  Talk with your health care provider about your diet, exercise habits, and other lifestyle factors that may be contributing to hypertension.  Visit your health care provider regularly. Your health care provider can help you create and adjust your plan for managing hypertension. Will I need medicine to control my blood pressure? Your health care provider may prescribe medicine if lifestyle changes are not enough to get your blood pressure under control, and if:  Your systolic blood pressure is 130 or higher.  Your diastolic blood pressure is 80 or higher.  Take medicines only as told by your health care provider. Follow the directions carefully. Blood pressure medicines must be taken as prescribed. The medicine does not work as well when you skip doses. Skipping doses also puts you at risk for problems. Contact a health care provider if:  You think you are having a reaction to medicines you have taken.  You have repeated (recurrent) headaches.  You feel dizzy.  You have swelling in your ankles.  You have trouble with your vision. Get help right away if:  You develop a severe headache or confusion.  You have unusual weakness or numbness, or you feel faint.  You have severe pain in your chest or abdomen.  You vomit repeatedly.  You have trouble breathing. Summary  Hypertension is when the force of blood pumping through  your arteries is too strong. If this condition is not controlled, it may put you at risk for serious complications.  Your personal target blood pressure may vary depending on your medical conditions, your age, and other factors. For most people, a normal blood pressure is less than 120/80.  Hypertension is managed by lifestyle changes, medicines, or both. Lifestyle changes include weight loss, eating a healthy, low-sodium diet, exercising more, and limiting alcohol. This information is not intended to replace advice given to you by your health care provider. Make sure you discuss any questions you have with your health care provider. Document Released: 01/02/2012 Document Revised: 03/07/2016 Document Reviewed: 03/07/2016 Elsevier Interactive Patient Education  2018 [employer]  

## 2017-09-05 NOTE — Progress Notes (Signed)
Jordan Walker - 32 y.o. male MRN [id number]  Date of birth: March 08, 1986  Subjective Chief Complaint  Patient presents with  . Follow-up    HPI Jordan Walker is a 32 y.o. male here today for follow up of HTN.  BP has improved but not fully controlled at this time. He is taking medications most of the time, he sometimes  Forgets on the weekend.  He continues to work on sodium reduction and says that he quit smoking about a week ago.  He denies side effects from current medication. He has not had chest pain, shortness of breath, palpitations, headache, dizziness, vision changes, muscle cramps.   ROS:  ROS completed and negative except as noted per HPI  No Known Allergies  Past Medical History:  Diagnosis Date  . GERD (gastroesophageal reflux disease)   . Hemorrhoids     Past Surgical History:  Procedure Laterality Date  . HEMORRHOID BANDING      Social History   Socioeconomic History  . Marital status: Married    Spouse name: Not on file  . Number of children: 3  . Years of education: 66  . Highest education level: Not on file  Occupational History  . Occupation: Camera operator  Social Needs  . Financial resource strain: Not on file  . Food insecurity:    Worry: Not on file    Inability: Not on file  . Transportation needs:    Medical: Not on file    Non-medical: Not on file  Tobacco Use  . Smoking status: Current Every Day Smoker    Packs/day: 2.00    Years: 12.00    Pack years: 24.00    Types: Cigars, Cigarettes  . Smokeless tobacco: Never Used  Substance and Sexual Activity  . Alcohol use: Yes    Alcohol/week: 0.6 oz    Types: 1 Cans of beer per week    Comment: Rarely  . Drug use: Yes    Frequency: 7.0 times per week    Types: Marijuana  . Sexual activity: Not on file  Lifestyle  . Physical activity:    Days per week: Not on file    Minutes per session: Not on file  . Stress: Not on file  Relationships  . Social connections:    Talks on phone: Not on  file    Gets together: Not on file    Attends religious service: Not on file    Active member of club or organization: Not on file    Attends meetings of clubs or organizations: Not on file    Relationship status: Not on file  Other Topics Concern  . Not on file  Social History Narrative  . Not on file    Family History  Problem Relation Age of Onset  . Diabetes Mother   . Hypertension Sister   . Hypertension Maternal Grandmother     Health Maintenance  Topic Date Due  . HIV Screening  08/10/1998  . INFLUENZA VACCINE  11/21/2017  . TETANUS/TDAP  04/10/2026    ----------------------------------------------------------------------------------------------------------------------------------------------------------------------------------------------------------------- Physical Exam BP 140/82 (BP Location: Left Arm, Patient Position: Sitting, Cuff Size: Normal)   Pulse 80   Wt 169 lb 6.4 oz (76.8 kg)   BMI 21.17 kg/m   Physical Exam  Constitutional: He is oriented to person, place, and time. He appears well-nourished. No distress.  HENT:  Head: Normocephalic and atraumatic.  Mouth/Throat: Oropharynx is clear and moist.  Eyes: No scleral icterus.  Neck: Neck supple. No thyromegaly present.  Cardiovascular: Normal rate, regular rhythm and normal heart sounds.  Pulmonary/Chest: Effort normal and breath sounds normal.  Neurological: He is alert and oriented to person, place, and time.  Psychiatric: He has a normal mood and affect. His behavior is normal.    ------------------------------------------------------------------------------------------------------------------------------------------------------------------------------------------------------------------- Assessment and Plan  Nicotine dependence with current use Quit smoking x1 week, congratulated.  Offered resources to help with continuation of cessation.    Essential hypertension Blood pressure is not at goal  at for age and co-morbidities.  I recommend increase of HCTZ to .  In addition they were instructed to follow a low sodium diet with regular exercise to help to maintain adequate control of blood pressure.

## 2017-09-28 ENCOUNTER — Encounter: Payer: Self-pay | Admitting: Family Medicine

## 2017-09-30 ENCOUNTER — Encounter: Payer: Self-pay | Admitting: Family Medicine

## 2017-09-30 ENCOUNTER — Ambulatory Visit: Payer: BLUE CROSS/BLUE SHIELD | Admitting: Family Medicine

## 2017-09-30 ENCOUNTER — Ambulatory Visit: Payer: Self-pay | Admitting: Family Medicine

## 2017-09-30 VITALS — BP 154/100 | HR 113 | Temp 98.2°F | Ht 75.0 in | Wt 160.8 lb

## 2017-09-30 DIAGNOSIS — I10 Essential (primary) hypertension: Secondary | ICD-10-CM

## 2017-09-30 LAB — BASIC METABOLIC PANEL
BUN: 22 mg/dL (ref 6–23)
CO2: 29 mEq/L (ref 19–32)
Calcium: 11.1 mg/dL — ABNORMAL HIGH (ref 8.4–10.5)
Chloride: 94 mEq/L — ABNORMAL LOW (ref 96–112)
Creatinine, Ser: 1.15 mg/dL (ref 0.40–1.50)
GFR: 93.54 mL/min (ref >60.00)
Glucose, Bld: 91 mg/dL (ref 70–99)
Potassium: 5.1 mEq/L (ref 3.5–5.1)
Sodium: 132 mEq/L — ABNORMAL LOW (ref 135–145)

## 2017-09-30 MED ORDER — AMLODIPINE BESYLATE 5 MG PO TABS: 5.0000 mg | ORAL_TABLET | Freq: Every day | ORAL | 1 refills | Status: DC

## 2017-09-30 NOTE — Patient Instructions (Signed)
Continue HCTZ Start amlodipine We'll call with lab results I will see you back in about 2-3 weeks.

## 2017-09-30 NOTE — Telephone Encounter (Signed)
Pt has symptoms of elevated blood pressure as well as a headache -  Has some nausea as well -  Taking meds as directed- alert and oriented.Speech is clear and intact.Same day appointment made for today with Jordan Walker. Reason for Disposition . Systolic BP  >= 160 OR Diastolic >= 100  Answer Assessment - Initial Assessment Questions 1. BLOOD PRESSURE: "What is the blood pressure?" "Did you take at least two measurements 5 minutes apart?"    157/102 L    156/93 R   2. ONSET: "When did you take your blood pressure?"     8 AM  TODAY 3. HOW: "How did you obtain the blood pressure?" (e.g., visiting nurse, automatic home BP monitor)     HOME  MACHINE  4. HISTORY: "Do you have a history of high blood pressure?"     Htn  5. MEDICATIONS: "Are you taking any medications for blood pressure?" "Have you missed any doses recently?"     Yes  Has not missed  Any  Doses   6. OTHER SYMPTOMS: "Do you have any symptoms?" (e.g., headache, chest pain, blurred vision, difficulty breathing, weakness)    Headache   Nauseated a little bit   7. PREGNANCY: "Is there any chance you are pregnant?" "When was your last menstrual period?"     n/a  Protocols used: HIGH BLOOD PRESSURE-A-AH

## 2017-09-30 NOTE — Assessment & Plan Note (Addendum)
BP elevated today Continue to work on sodium reduction and quitting smoking Continue HCTZ Add Amlodipine 5mg  Check BMP as he is having some cramping Reviewed red flags such as chest pain, worsening headaches or vision changes- should seek emergency care.

## 2017-09-30 NOTE — Progress Notes (Signed)
Jordan Walker - 32 y.o. male MRN [id number]  Date of birth: 03-28-1986  Subjective Chief Complaint  Patient presents with  . Headache    been feeling light headed since last saturday, comes and goes., stomach has been hurting since Friday night  . Hypertension    157/102 this morning, been running high for over a week    HPI Jordan Walker is a 32 y.o. male here today for follow up of HTN.  He reports that BP has been elevated over the past week with readings at home between 140-155/90-100.  He is taking HCTZ daily.  He has been working on sodium reduction.  He had had a dull headache, mild dizziness with postural change and had had some cramping in the calf muscles.  He denies anginal symptoms, shortness of breath, palpitations, nausea or vomiting.  He did had some abdominal pain with reflux last night but that has resolved.   ROS: ROS completed and negative except as noted per HPI  No Known Allergies  Past Medical History:  Diagnosis Date  . GERD (gastroesophageal reflux disease)   . Hemorrhoids     Past Surgical History:  Procedure Laterality Date  . HEMORRHOID BANDING      Social History   Socioeconomic History  . Marital status: Married    Spouse name: Not on file  . Number of children: 3  . Years of education: 42  . Highest education level: Not on file  Occupational History  . Occupation: Camera operator  Social Needs  . Financial resource strain: Not on file  . Food insecurity:    Worry: Not on file    Inability: Not on file  . Transportation needs:    Medical: Not on file    Non-medical: Not on file  Tobacco Use  . Smoking status: Current Every Day Smoker    Packs/day: 2.00    Years: 12.00    Pack years: 24.00    Types: Cigars, Cigarettes  . Smokeless tobacco: Never Used  Substance and Sexual Activity  . Alcohol use: Yes    Alcohol/week: 0.6 oz    Types: 1 Cans of beer per week    Comment: Rarely  . Drug use: Yes    Frequency: 7.0 times per week   Types: Marijuana  . Sexual activity: Not on file  Lifestyle  . Physical activity:    Days per week: Not on file    Minutes per session: Not on file  . Stress: Not on file  Relationships  . Social connections:    Talks on phone: Not on file    Gets together: Not on file    Attends religious service: Not on file    Active member of club or organization: Not on file    Attends meetings of clubs or organizations: Not on file    Relationship status: Not on file  Other Topics Concern  . Not on file  Social History Narrative  . Not on file    Family History  Problem Relation Age of Onset  . Diabetes Mother   . Hypertension Sister   . Hypertension Maternal Grandmother     Health Maintenance  Topic Date Due  . HIV Screening  08/10/1998  . INFLUENZA VACCINE  11/21/2017  . TETANUS/TDAP  04/10/2026    ----------------------------------------------------------------------------------------------------------------------------------------------------------------------------------------------------------------- Physical Exam BP (!) 154/100 (BP Location: Left Arm, Patient Position: Sitting, Cuff Size: Normal)   Pulse (!) 113   Temp 98.2 F (36.8 C) (Oral)   Ht 6'  3" (1.905 m)   Wt 160 lb 12.8 oz (72.9 kg)   SpO2 98%   BMI 20.10 kg/m   Physical Exam  Constitutional: He is oriented to person, place, and time. He appears well-nourished. No distress.  HENT:  Head: Normocephalic and atraumatic.  Mouth/Throat: Oropharynx is clear and moist.  Eyes: EOM are normal. No scleral icterus.  Neck: Normal range of motion. Neck supple. No neck rigidity.  Cardiovascular: Normal rate, regular rhythm and normal heart sounds.  Pulmonary/Chest: Effort normal and breath sounds normal. No respiratory distress.  Musculoskeletal: He exhibits no edema.  Lymphadenopathy:    He has no cervical adenopathy.  Neurological: He is alert and oriented to person, place, and time.  Skin: Skin is warm and dry.  No rash noted.  Psychiatric: He has a normal mood and affect. His behavior is normal.    ------------------------------------------------------------------------------------------------------------------------------------------------------------------------------------------------------------------- Assessment and Plan  Essential hypertension BP elevated today Continue to work on sodium reduction and quitting smoking Continue HCTZ Add Amlodipine 5mg  Check BMP as he is having some cramping Reviewed red flags such as chest pain, worsening headaches or vision changes- should seek emergency care.

## 2017-10-01 ENCOUNTER — Ambulatory Visit: Payer: Self-pay | Admitting: *Deleted

## 2017-10-01 NOTE — Telephone Encounter (Signed)
Go ahead and increase amlodipine to 2 tabs (10mg )/day.  May go ahead and take an additional tab for today.  Monitor BP at home.  If not responding to this have him see me again Thurs or Friday.  If symptoms of headache or dizziness are significantly worsening he should be seen in ER.

## 2017-10-01 NOTE — Telephone Encounter (Signed)
Pt saw Dr. Molli HazardMatthew yesterday and another BP med added to help lower BP. This morning pt report of having headache and light headed with BP of : 6:30am 148/107   8:50am 154/106 HR 125   9:15am 156/97 HR 87. Denied chest pain and taking med as prescribed. Please advise.

## 2017-10-01 NOTE — Telephone Encounter (Signed)
Spoke to Jordan Walker and informed him of Dr Ashley RoyaltyMatthews orders. Jordan Walker verbalized understanding. TLG

## 2017-10-01 NOTE — Telephone Encounter (Signed)
Pt's wife called with her husband having elevated b/ps,. At 6:30 this morning he was 148/102. He took his medication. At 8:50 his b/p was 154/106 and HR of 125. His b/p was checked again at 0910 and it was 156/97 HR 87.  He is c/o some dizziness, lightheadedness, and having a headache.  He is too dizzy to work. [facility] spoke with [place]. Will wait for a call back from his pcp. Home care advice given with verbal understanding. Also advised to call 911 if symptoms worsen including having chest pains. Wife voiced understanding.  Reason for Disposition . [1] Systolic BP  >= 140 OR Diastolic >= 90 AND [2] taking BP medications  Answer Assessment - Initial Assessment Questions 1. BLOOD PRESSURE: "What is the blood pressure?" "Did you take at least two measurements 5 minutes apart?"     148/102 , 154/106 HR 125 and now 156/97 HR 87 2. ONSET: "When did you take your blood pressure?"     This morning at 6:30 and, just now 3. HOW: "How did you obtain the blood pressure?" (e.g., visiting nurse, automatic home BP monitor)     Automatic home  BP monitor 4. HISTORY: "Do you have a history of high blood pressure?"     yes 5. MEDICATIONS: "Are you taking any medications for blood pressure?" "Have you missed any doses recently?"     no 6. OTHER SYMPTOMS: "Do you have any symptoms?" (e.g., headache, chest pain, blurred vision, difficulty breathing, weakness)     Dizziness, lightheaded and headache 7. PREGNANCY: "Is there any chance you are pregnant?" "When was your last menstrual period?"     n/a  Protocols used: HIGH BLOOD PRESSURE-A-AH

## 2017-10-04 ENCOUNTER — Other Ambulatory Visit: Payer: Self-pay | Admitting: Family Medicine

## 2017-10-04 DIAGNOSIS — I10 Essential (primary) hypertension: Secondary | ICD-10-CM

## 2017-10-04 NOTE — Progress Notes (Signed)
Sodium and chloride noted to be low on recent labs, please have him stop back in to recheck.  If remains low we'll need to stop his HCTZ and try something different in place of this.  Please ask how his BP is doing with increase in amlodipine.  Thanks!

## 2017-10-07 ENCOUNTER — Other Ambulatory Visit (INDEPENDENT_AMBULATORY_CARE_PROVIDER_SITE_OTHER): Payer: BLUE CROSS/BLUE SHIELD

## 2017-10-07 DIAGNOSIS — I10 Essential (primary) hypertension: Secondary | ICD-10-CM

## 2017-10-07 LAB — BASIC METABOLIC PANEL
BUN: 17 mg/dL (ref 6–23)
CO2: 30 mEq/L (ref 19–32)
Calcium: 9.9 mg/dL (ref 8.4–10.5)
Chloride: 98 mEq/L (ref 96–112)
Creatinine, Ser: 0.94 mg/dL (ref 0.40–1.50)
GFR: 118.03 mL/min (ref >60.00)
Glucose, Bld: 99 mg/dL (ref 70–99)
Potassium: 3.8 mEq/L (ref 3.5–5.1)
Sodium: 135 mEq/L (ref 135–145)

## 2017-10-08 NOTE — Progress Notes (Signed)
Repeat lab testing is normal.  May have been related to some mild dehydration.  Continue current medications for now.

## 2017-10-22 ENCOUNTER — Ambulatory Visit: Payer: BLUE CROSS/BLUE SHIELD | Admitting: Family Medicine

## 2017-10-25 ENCOUNTER — Encounter: Payer: Self-pay | Admitting: Family Medicine

## 2017-10-25 ENCOUNTER — Ambulatory Visit: Payer: BLUE CROSS/BLUE SHIELD | Admitting: Family Medicine

## 2017-10-25 DIAGNOSIS — I10 Essential (primary) hypertension: Secondary | ICD-10-CM | POA: Diagnosis not present

## 2017-10-25 NOTE — Progress Notes (Signed)
Jordan Walker - 32 y.o. male MRN [id number]  Date of birth: 1985/09/27  Subjective Chief Complaint  Patient presents with  . Follow-up    F/U for BP, been keeping a log, fewer headaches    HPI Jordan Walker is a 32 y.o. male here today for follow up of hypertension.  Reports compliance with current medications.  Has been monitoring BP at home with readings typically of 130-138/82-88.  He is tolerating medication without significant side effects.  Reports he is having decreased frequency of headaches now that BP is better controlled.  He denies chest pain, shortness of breath, palpitations, vision changes or edema.   ROS:  ROS completed and negative except as noted per HPI  No Known Allergies  Past Medical History:  Diagnosis Date  . GERD (gastroesophageal reflux disease)   . Hemorrhoids     Past Surgical History:  Procedure Laterality Date  . HEMORRHOID BANDING      Social History   Socioeconomic History  . Marital status: Married    Spouse name: Not on file  . Number of children: 3  . Years of education: 3013  . Highest education level: Not on file  Occupational History  . Occupation: Camera operatorAssistant Associate  Social Needs  . Financial resource strain: Not on file  . Food insecurity:    Worry: Not on file    Inability: Not on file  . Transportation needs:    Medical: Not on file    Non-medical: Not on file  Tobacco Use  . Smoking status: Current Every Day Smoker    Packs/day: 2.00    Years: 12.00    Pack years: 24.00    Types: Cigars, Cigarettes  . Smokeless tobacco: Never Used  Substance and Sexual Activity  . Alcohol use: Yes    Alcohol/week: 0.6 oz    Types: 1 Cans of beer per week    Comment: Rarely  . Drug use: Yes    Frequency: 7.0 times per week    Types: Marijuana  . Sexual activity: Not on file  Lifestyle  . Physical activity:    Days per week: Not on file    Minutes per session: Not on file  . Stress: Not on file  Relationships  . Social connections:      Talks on phone: Not on file    Gets together: Not on file    Attends religious service: Not on file    Active member of club or organization: Not on file    Attends meetings of clubs or organizations: Not on file    Relationship status: Not on file  Other Topics Concern  . Not on file  Social History Narrative  . Not on file    Family History  Problem Relation Age of Onset  . Diabetes Mother   . Hypertension Sister   . Hypertension Maternal Grandmother     Health Maintenance  Topic Date Due  . HIV Screening  08/10/1998  . INFLUENZA VACCINE  11/21/2017  . TETANUS/TDAP  04/10/2026    ----------------------------------------------------------------------------------------------------------------------------------------------------------------------------------------------------------------- Physical Exam BP 128/84 (BP Location: Left Arm, Patient Position: Sitting, Cuff Size: Normal)   Pulse 71   Temp 98.4 F (36.9 C) (Oral)   Ht 6\' 3"  (1.905 m)   Wt 167 lb (75.8 kg)   SpO2 99%   BMI 20.87 kg/m   Physical Exam  Constitutional: He is oriented to person, place, and time. He appears well-nourished. No distress.  HENT:  Head: Normocephalic and atraumatic.  Mouth/Throat:  Oropharynx is clear and moist.  Eyes: No scleral icterus.  Neck: Neck supple. No thyromegaly present.  Cardiovascular: Normal rate, regular rhythm, normal heart sounds and intact distal pulses.  Pulmonary/Chest: Effort normal and breath sounds normal.  Musculoskeletal: He exhibits no edema.  Lymphadenopathy:    He has no cervical adenopathy.  Neurological: He is alert and oriented to person, place, and time.  Psychiatric: He has a normal mood and affect. His behavior is normal.    ------------------------------------------------------------------------------------------------------------------------------------------------------------------------------------------------------------------- Assessment  and Plan  Essential hypertension BP is better controlled today Continue current medications Follow low salt diet He will continue to monitor at home F/u in 3-4 months.

## 2017-10-25 NOTE — Patient Instructions (Signed)
BP is looking much better Continue current medications Watch salt intake I will see you back in 3-4 months or sooner if needed.

## 2017-10-25 NOTE — Assessment & Plan Note (Signed)
BP is better controlled today Continue current medications Follow low salt diet He will continue to monitor at home F/u in 3-4 months.

## 2018-02-17 IMAGING — CT CT ABD-PELV W/ CM
2 of 4 series · 15 of 46 positions shown, 17 images · IV contrast (APPLIED)
Comparison: None.

CLINICAL DATA: Lower abdominal pain and tenderness

EXAM:
CT ABDOMEN AND PELVIS WITH CONTRAST
TECHNIQUE: Multidetector CT imaging of the abdomen and pelvis was performed
using the standard protocol following bolus administration of
intravenous contrast.
CONTRAST:  100mL O33SDH-RNN IOPAMIDOL (O33SDH-RNN) INJECTION 61%

[Series 2: abd/pelvis w/cm · axial · 0.65mm/px · z∈[-363,+57]mm · 12 of 96 slices shown, 14 images]
[im 8/96  soft-tissue]
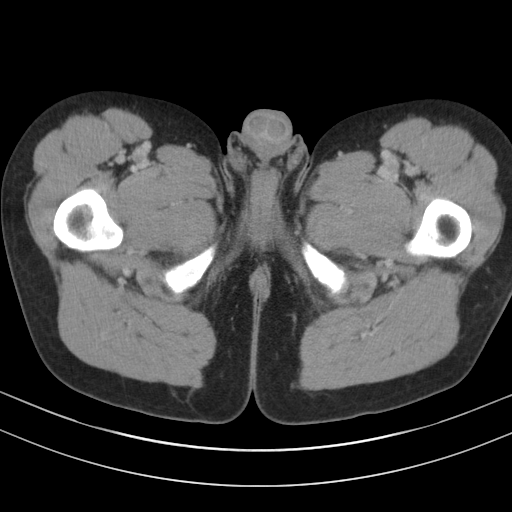
[im 8/96  bone]
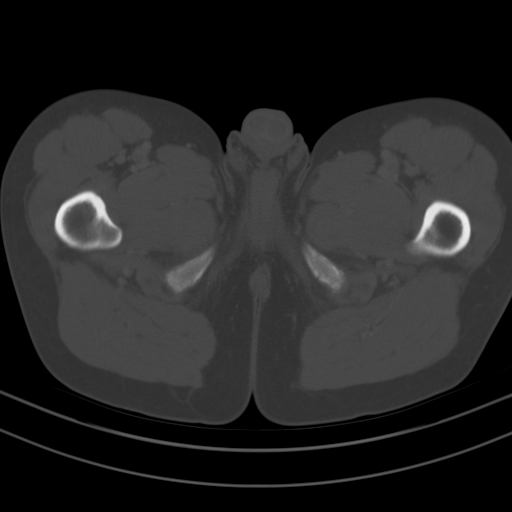
[im 16/96  soft-tissue]
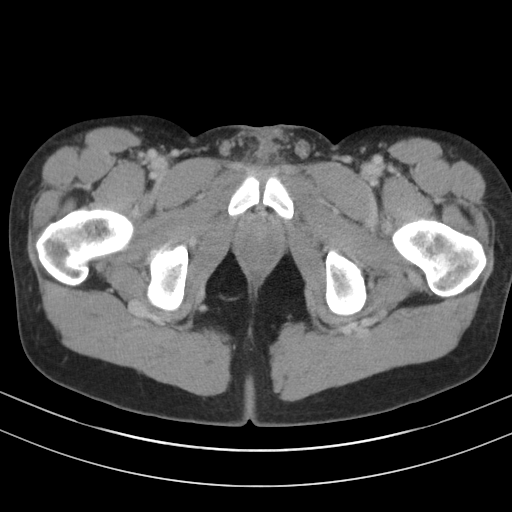
[im 23/96  soft-tissue]
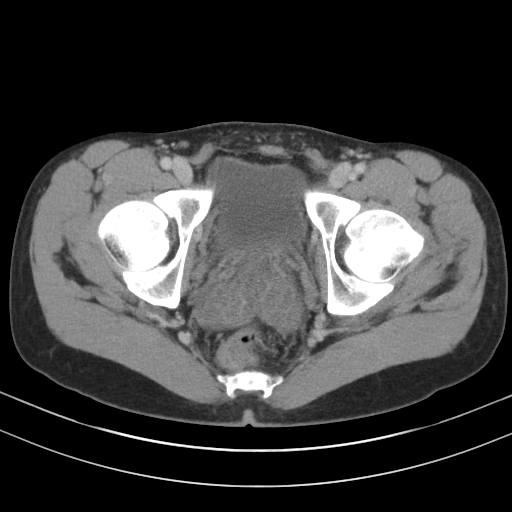
[im 31/96  soft-tissue]
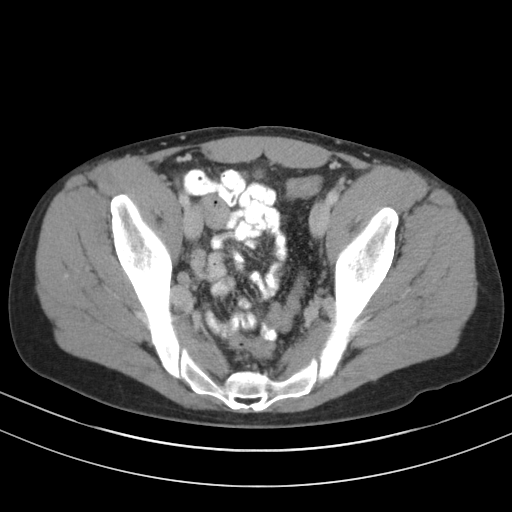
[im 39/96  soft-tissue]
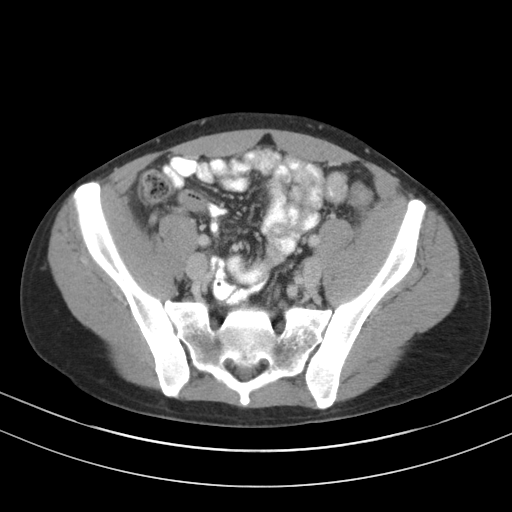
[im 46/96  soft-tissue]
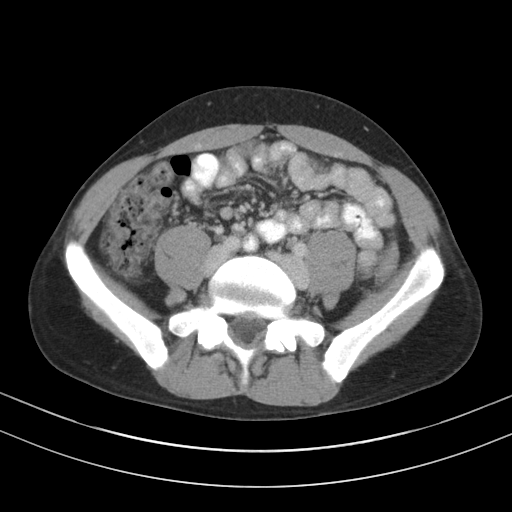
[im 54/96  soft-tissue]
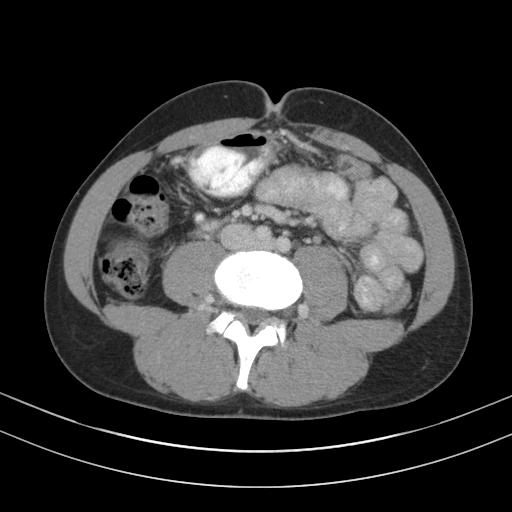
[im 61/96  soft-tissue]
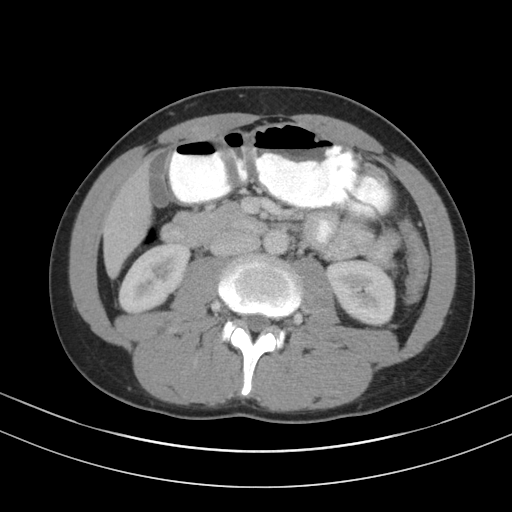
[im 69/96  soft-tissue]
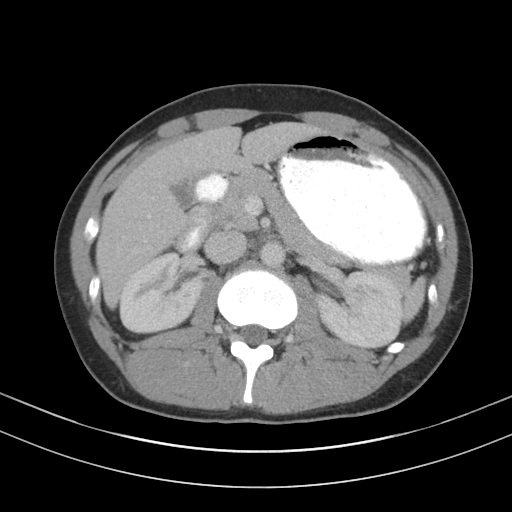
[im 69/96  bone]
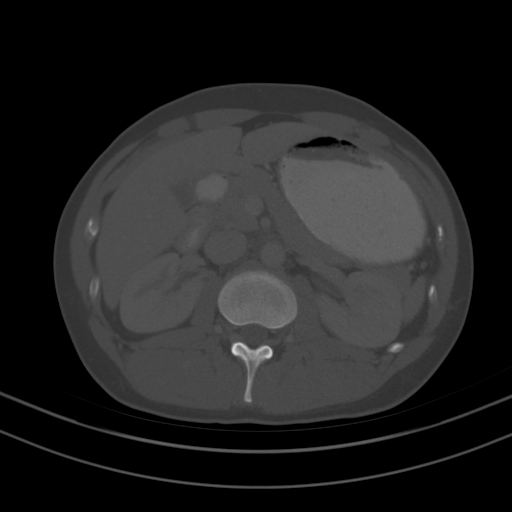
[im 77/96  soft-tissue]
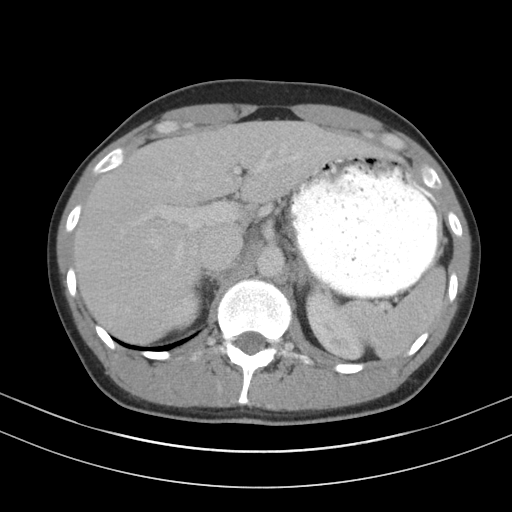
[im 84/96  soft-tissue]
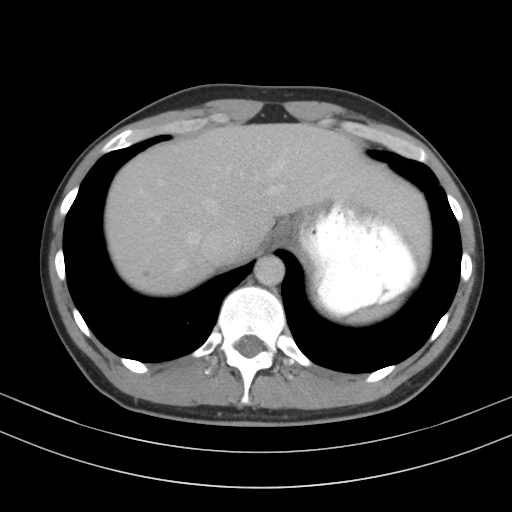
[im 92/96  soft-tissue]
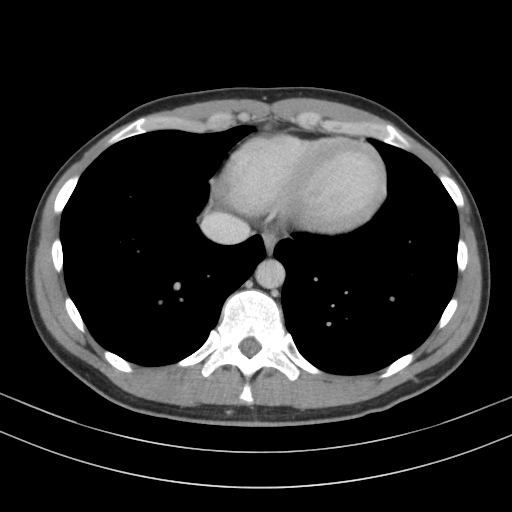

[Series 3: cor · coronal · 0.60mm/px · 3 of 75 slices shown]
[im 25/75  soft-tissue]
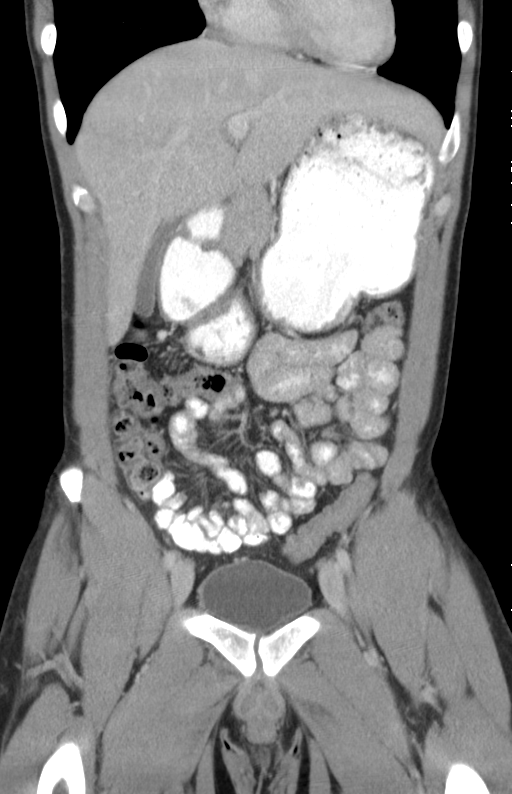
[im 33/75  soft-tissue]
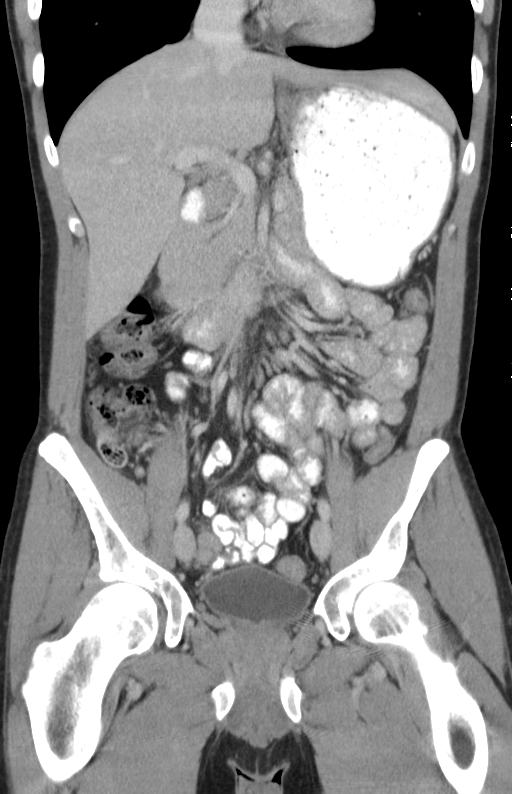
[im 42/75  soft-tissue]
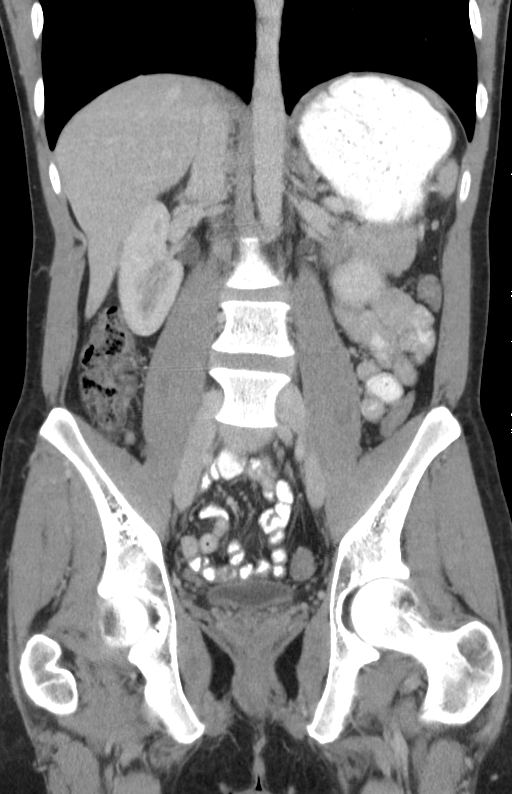

[15 of 46 positions shown; findings below may reference images not displayed]

FINDINGS: Lower chest: No acute abnormality.

Hepatobiliary: No focal liver abnormality is seen. No gallstones,
gallbladder wall thickening, or biliary dilatation.

Pancreas: Unremarkable. No pancreatic ductal dilatation or
surrounding inflammatory changes.

Spleen: Normal in size without focal abnormality.

Adrenals/Urinary Tract: Adrenal glands are unremarkable. Kidneys are
normal, without renal calculi, focal lesion, or hydronephrosis.
Bladder is unremarkable.

Stomach/Bowel: Stomach is within normal limits. Appendix appears
normal. No evidence of bowel wall thickening, distention, or
inflammatory changes.

Vascular/Lymphatic: No significant vascular findings are present. No
enlarged abdominal or pelvic lymph nodes.

Reproductive: Prostate appears within normal limits. The seminal
vesicles however appear enlarged and show some mild surrounding
inflammatory change as well as some mild venous engorgement. This
may represent some localized inflammatory change.

Other: No abdominal wall hernia or abnormality. No abdominopelvic
ascites.

Musculoskeletal: No acute or significant osseous findings.
IMPRESSION: Prominence of the seminal vesicles with some localized venous
engorgement and surrounding edema. This likely represents some
underlying inflammatory process. This may be the etiology of the
patient's underlying pelvic pain.

No definitive mass lesion is seen correspond with the patient's
given clinical symptomatology.

## 2018-03-10 ENCOUNTER — Ambulatory Visit: Payer: BLUE CROSS/BLUE SHIELD | Admitting: Family Medicine

## 2018-03-10 DIAGNOSIS — Z0289 Encounter for other administrative examinations: Secondary | ICD-10-CM

## 2018-03-17 ENCOUNTER — Other Ambulatory Visit: Payer: Self-pay | Admitting: Family Medicine

## 2018-09-22 ENCOUNTER — Other Ambulatory Visit: Payer: Self-pay | Admitting: Family Medicine

## 2018-09-22 DIAGNOSIS — I10 Essential (primary) hypertension: Secondary | ICD-10-CM

## 2018-09-22 NOTE — Telephone Encounter (Signed)
Pt need f/u, can be VV

## 2018-09-22 NOTE — Telephone Encounter (Signed)
Tried to call Pt again. LAM again .

## 2018-09-22 NOTE — Telephone Encounter (Signed)
Called Pt Jordan Walker to call office to make an appointment . Rx denied , Needs OV .

## 2018-09-23 NOTE — Telephone Encounter (Signed)
Tried to call Pt again. LAM  & MyChart message him. Called [facility] [phone] , pharmacy on file, informed them of Rx denied due to Pt needed OV. Pt has been contacted multiple times and ways without success of him answering. Closing task.

## 2018-10-09 ENCOUNTER — Encounter: Payer: Self-pay | Admitting: Family Medicine

## 2018-10-09 ENCOUNTER — Telehealth: Payer: BC Managed Care – PPO | Admitting: Family Medicine

## 2018-10-10 ENCOUNTER — Telehealth: Payer: Self-pay

## 2018-10-10 NOTE — Telephone Encounter (Signed)
Questions for Screening COVID-19  Symptom onset: none   Travel or Contacts: none During this illness, did/does the patient experience any of the following symptoms? Fever >100.59F []   Yes [x]   No []   Unknown Subjective fever (felt feverish) []   Yes [x]   No []   Unknown Chills []   Yes [x]   No []   Unknown Muscle aches (myalgia) []   Yes [x]   No []   Unknown Runny nose (rhinorrhea) []   Yes [x]   No []   Unknown Sore throat []   Yes [x]   No []   Unknown Cough (new onset or worsening of chronic cough) []   Yes [x]   No []   Unknown Shortness of breath (dyspnea) []   Yes [x]   No []   Unknown Nausea or vomiting []   Yes [x]   No []   Unknown Headache []   Yes [x]   No []   Unknown Abdominal pain  []   Yes [x]   No []   Unknown Diarrhea (?3 loose/looser than normal stools/24hr period) []   Yes [x]   No []   Unknown Other, specify:  Patient risk factors: Smoker? [x]   Current []   Former []   Never If male, currently pregnant? []   Yes [x]   No  Patient Active Problem List   Diagnosis Date Noted  . Nicotine dependence with current use 08/06/2017  . Essential hypertension 07/29/2017  . Gastroesophageal reflux disease with esophagitis 11/06/2016  . Routine general medical examination at a health care facility 04/10/2016    Plan:  []   High risk for COVID-19 with red flags go to ED (with CP, SOB, weak/lightheaded, or fever > 101.5). Call ahead.  []   High risk for COVID-19 but stable. Inform provider and coordinate time for [facility] visit.   [x]   No red flags but URI signs or symptoms okay for [facility] visit.

## 2018-10-13 ENCOUNTER — Encounter: Payer: Self-pay | Admitting: Family Medicine

## 2018-10-13 ENCOUNTER — Ambulatory Visit: Payer: BC Managed Care – PPO | Admitting: Family Medicine

## 2018-10-13 VITALS — BP 140/92 | HR 88 | Temp 98.5°F | Resp 18 | Ht 73.25 in | Wt 172.0 lb

## 2018-10-13 DIAGNOSIS — Z13220 Encounter for screening for lipoid disorders: Secondary | ICD-10-CM

## 2018-10-13 DIAGNOSIS — I10 Essential (primary) hypertension: Secondary | ICD-10-CM

## 2018-10-13 DIAGNOSIS — F17200 Nicotine dependence, unspecified, uncomplicated: Secondary | ICD-10-CM | POA: Diagnosis not present

## 2018-10-13 MED ORDER — HYDROCHLOROTHIAZIDE 25 MG PO TABS: 25.0000 mg | ORAL_TABLET | Freq: Every day | ORAL | 2 refills | Status: DC

## 2018-10-13 NOTE — Assessment & Plan Note (Signed)
Counseled on importance of smoking cessation.

## 2018-10-13 NOTE — Assessment & Plan Note (Signed)
-  BP elevated today, recommend restarting medication.  -Follow low salt diet -Continue to cut back on smoking.  Orders Placed This Encounter  Procedures  . Basic Metabolic Panel (BMET)  . CBC  . Lipid panel  . TSH

## 2018-10-13 NOTE — Patient Instructions (Signed)

## 2018-10-13 NOTE — Progress Notes (Signed)
Jordan Walker - 33 y.o. male MRN [id number]  Date of birth: Jun 17, 1985  Subjective Chief Complaint  Patient presents with  . Follow-up    12 mo F/U HTN, Labs.     HPI Jordan Walker is a 33 y.o. male with history of HTN here today for follow up of HTN.  He reports he has been doing well.   He is currently out of HCTZ for treatment of his BP.  He did quit smoking MJ about 7 months ago.  Still smoking 1-2 black and mild cigars/day, working on cutting back on this.  He denies any symptoms related to HTN including chest pain, shortness of breath, palpitations, headache or vision changes.    ROS:  A comprehensive ROS was completed and negative except as noted per HPI  No Known Allergies  Past Medical History:  Diagnosis Date  . Essential hypertension 07/29/2017  . GERD (gastroesophageal reflux disease)   . Hemorrhoids     Past Surgical History:  Procedure Laterality Date  . HEMORRHOID BANDING      Social History   Socioeconomic History  . Marital status: Married    Spouse name: Not on file  . Number of children: 3  . Years of education: 1  . Highest education level: Not on file  Occupational History  . Occupation: Doctor, general practice  Social Needs  . Financial resource strain: Not on file  . Food insecurity    Worry: Never true    Inability: Never true  . Transportation needs    Medical: No    Non-medical: No  Tobacco Use  . Smoking status: Current Every Day Smoker    Packs/day: 2.00    Years: 12.00    Pack years: 24.00    Types: Cigars, Cigarettes  . Smokeless tobacco: Never Used  Substance and Sexual Activity  . Alcohol use: Yes    Alcohol/week: 1.0 standard drinks    Types: 1 Cans of beer per week    Comment: Rarely  . Drug use: Not Currently    Frequency: 7.0 times per week    Types: Marijuana    Comment: Quit 7 mo ago   . Sexual activity: Not on file  Lifestyle  . Physical activity    Days per week: Not on file    Minutes per session: Not on file  .  Stress: Only a little  Relationships  . Social Herbalist on phone: Twice a week    Gets together: Twice a week    Attends religious service: Not on file    Active member of club or organization: Not on file    Attends meetings of clubs or organizations: Not on file    Relationship status: Not on file  Other Topics Concern  . Not on file  Social History Narrative  . Not on file    Family History  Problem Relation Age of Onset  . Diabetes Mother   . Hypertension Sister   . Hypertension Maternal Grandmother     Health Maintenance  Topic Date Due  . HIV Screening  08/10/1998  . INFLUENZA VACCINE  11/22/2018  . TETANUS/TDAP  04/10/2026    ----------------------------------------------------------------------------------------------------------------------------------------------------------------------------------------------------------------- Physical Exam BP (!) 140/92   Pulse 88   Temp 98.5 F (36.9 C) (Oral)   Resp 18   Ht 6' 1.25" (1.861 m)   Wt 172 lb (78 kg)   SpO2 98%   BMI 22.54 kg/m   Physical Exam Constitutional:  Appearance: Normal appearance.  HENT:     Head: Normocephalic and atraumatic.     Right Ear: Tympanic membrane normal.     Left Ear: Tympanic membrane normal.     Mouth/Throat:     Mouth: Mucous membranes are moist.  Eyes:     General: No scleral icterus. Cardiovascular:     Rate and Rhythm: Normal rate and regular rhythm.  Pulmonary:     Effort: Pulmonary effort is normal.     Breath sounds: Normal breath sounds.  Musculoskeletal:     Right lower leg: No edema.     Left lower leg: No edema.  Skin:    General: Skin is warm and dry.  Neurological:     General: No focal deficit present.     Mental Status: He is alert.  Psychiatric:        Mood and Affect: Mood normal.        Behavior: Behavior normal.      ------------------------------------------------------------------------------------------------------------------------------------------------------------------------------------------------------------------- Assessment and Plan  Essential hypertension -BP elevated today, recommend restarting medication.  -Follow low salt diet -Continue to cut back on smoking.  Orders Placed This Encounter  Procedures  . Basic Metabolic Panel (BMET)  . CBC  . Lipid panel  . TSH     Nicotine dependence with current use -Counseled on importance of smoking cessation.

## 2018-10-13 NOTE — Addendum Note (Signed)
Addended by: Perlie Mayo on: 10/13/2018 02:25 PM   Modules accepted: Orders

## 2018-10-14 LAB — CBC
HCT: 45.3 % (ref 38.5–50.0)
Hemoglobin: 15 g/dL (ref 13.2–17.1)
MCH: 27.2 pg (ref 27.0–33.0)
MCHC: 33.1 g/dL (ref 32.0–36.0)
MCV: 82.1 fL (ref 80.0–100.0)
MPV: 10.9 fL (ref 7.5–12.5)
Platelets: 186 10*3/uL (ref 140–400)
RBC: 5.52 10*6/uL (ref 4.20–5.80)
RDW: 13.4 % (ref 11.0–15.0)
WBC: 6.7 10*3/uL (ref 3.8–10.8)

## 2018-10-14 LAB — LIPID PANEL
Cholesterol: 158 mg/dL (ref <200)
HDL: 47 mg/dL (ref >=40)
LDL Cholesterol (Calc): 87 mg/dL (calc)
Non-HDL Cholesterol (Calc): 111 mg/dL (calc) (ref <130)
Total CHOL/HDL Ratio: 3.4 (calc) (ref <5.0)
Triglycerides: 139 mg/dL (ref <150)

## 2018-10-14 LAB — TSH: TSH: 1.36 mIU/L (ref 0.40–4.50)

## 2018-10-14 LAB — BASIC METABOLIC PANEL
BUN: 17 mg/dL (ref 7–25)
CO2: 26 mmol/L (ref 20–32)
Calcium: 10.1 mg/dL (ref 8.6–10.3)
Chloride: 103 mmol/L (ref 98–110)
Creat: 1.11 mg/dL (ref 0.60–1.35)
Glucose, Bld: 88 mg/dL (ref 65–99)
Potassium: 4.3 mmol/L (ref 3.5–5.3)
Sodium: 138 mmol/L (ref 135–146)

## 2019-03-06 DIAGNOSIS — Z7189 Other specified counseling: Secondary | ICD-10-CM | POA: Diagnosis not present

## 2019-03-06 DIAGNOSIS — Z20828 Contact with and (suspected) exposure to other viral communicable diseases: Secondary | ICD-10-CM | POA: Diagnosis not present

## 2019-03-07 DIAGNOSIS — Z03818 Encounter for observation for suspected exposure to other biological agents ruled out: Secondary | ICD-10-CM | POA: Diagnosis not present

## 2019-03-08 DIAGNOSIS — Z20828 Contact with and (suspected) exposure to other viral communicable diseases: Secondary | ICD-10-CM | POA: Diagnosis not present

## 2019-05-13 ENCOUNTER — Ambulatory Visit
Admission: EM | Admit: 2019-05-13 | Discharge: 2019-05-13 | Disposition: A | Discharge status: Home / Self Care | Payer: BC Managed Care – PPO

## 2019-05-13 DIAGNOSIS — W228XXA Striking against or struck by other objects, initial encounter: Secondary | ICD-10-CM | POA: Diagnosis not present

## 2019-05-13 DIAGNOSIS — S80812A Abrasion, left lower leg, initial encounter: Secondary | ICD-10-CM

## 2019-05-13 DIAGNOSIS — I10 Essential (primary) hypertension: Secondary | ICD-10-CM | POA: Diagnosis not present

## 2019-05-13 DIAGNOSIS — Z23 Encounter for immunization: Secondary | ICD-10-CM

## 2019-05-13 MED ORDER — TETANUS-DIPHTH-ACELL PERTUSSIS 5-2.5-18.5 LF-MCG/0.5 IM SUSP
0.5000 mL | Freq: Once | INTRAMUSCULAR | Status: AC
  Administered 2019-05-13: 0.5 mL via INTRAMUSCULAR

## 2019-05-13 NOTE — ED Triage Notes (Signed)
Pt states fell going up stairs and has a small lac and abrasion to LLE with little swelling. Bleeding controlled, bandage applied.

## 2019-05-13 NOTE — ED Provider Notes (Signed)
[facility]    CSN: [id number] Arrival date & time: 05/13/19  1811      History   Chief Complaint Chief Complaint  Patient presents with  . Laceration    HPI Jordan Walker is a 34 y.o. male with history of hypertension presenting for left shin abrasion.  Patient was able to achieve hemostasis PTA with direct pressure.  Denies anticoagulant use, easy bruising/bleeding.  Patient states that this happened when he tripped going up the stairs.  Denies head trauma, LOC.  Last tetanus greater than 6 years ago.   Past Medical History:  Diagnosis Date  . Essential hypertension 07/29/2017  . GERD (gastroesophageal reflux disease)   . Hemorrhoids     Patient Active Problem List   Diagnosis Date Noted  . Nicotine dependence with current use 08/06/2017  . Essential hypertension 07/29/2017  . Gastroesophageal reflux disease with esophagitis 11/06/2016  . Routine general medical examination at a health care facility 04/10/2016    Past Surgical History:  Procedure Laterality Date  . HEMORRHOID BANDING         Home Medications    Prior to Admission medications   Medication Sig Start Date End Date Taking? Authorizing Provider  amLODipine (NORVASC) 2.5 MG tablet Take 2.5 mg by mouth daily.   Yes [provider]    Family History Family History  Problem Relation Age of Onset  . Diabetes Mother   . Hypertension Sister   . Hypertension Maternal Grandmother     Social History Social History   Tobacco Use  . Smoking status: Current Every Day Smoker    Packs/day: 2.00    Years: 12.00    Pack years: 24.00    Types: Cigars, Cigarettes  . Smokeless tobacco: Never Used  Substance Use Topics  . Alcohol use: Yes    Alcohol/week: 1.0 standard drinks    Types: 1 Cans of beer per week    Comment: Rarely  . Drug use: Not Currently    Frequency: 7.0 times per week    Comment: Quit 7 mo ago      Allergies   Patient has no known allergies.   Review of  Systems As per HPI   Physical Exam Triage Vital Signs ED Triage Vitals  Enc Vitals Group     BP      Pulse      Resp      Temp      Temp src      SpO2      Weight      Height      Head Circumference      Peak Flow      Pain Score      Pain Loc      Pain Edu?      Excl. in GC?    No data found.  Updated Vital Signs BP (!) 171/89 (BP Location: Left Arm) Comment: states hasn't had his meds in 2 days  Pulse 90   Temp 99.4 F (37.4 C) (Oral)   Resp 16   SpO2 98%   Visual Acuity Right Eye Distance:   Left Eye Distance:   Bilateral Distance:    Right Eye Near:   Left Eye Near:    Bilateral Near:     Physical Exam Constitutional:      General: He is not in acute distress. HENT:     Head: Normocephalic and atraumatic.  Eyes:     General: No scleral icterus.  Pupils: Pupils are equal, round, and reactive to light.  Cardiovascular:     Rate and Rhythm: Normal rate.  Pulmonary:     Effort: Pulmonary effort is normal. No respiratory distress.     Breath sounds: No wheezing.  Musculoskeletal:        General: Swelling, tenderness and signs of injury present. Normal range of motion.     Right lower leg: No edema.     Left lower leg: No edema.  Skin:    Coloration: Skin is not jaundiced or pale.     Comments: 2 cm superficial abrasion to left mid shin that is mildly tender with surrounding edema.  No active discharge, bleeding.  Wound is clean/without foreign body.  Neurological:     Mental Status: He is alert and oriented to person, place, and time.     Sensory: No sensory deficit.     Gait: Gait normal.      UC Treatments / Results  Labs (all labs ordered are listed, but only abnormal results are displayed) Labs Reviewed - No data to display  EKG   Radiology No results found.  Procedures Procedures (including critical care time)  Medications Ordered in UC Medications  Tdap (BOOSTRIX) injection 0.5 mL (0.5 mLs Intramuscular Given 05/13/19 1906)      Initial Impression / Assessment and Plan / UC Course  I have reviewed the triage vital signs and the nursing notes.  Pertinent labs & imaging results that were available during my care of the patient were reviewed by me and considered in my medical decision making (see chart for details).   I have reviewed the triage vital signs and the nursing notes.  All pertinent labs & imaging results that were available during my care of the patient were reviewed by me and considered in my medical decision making (see chart for details).  Patient with superficial abrasion that is with controlled bleeding.  Pressure dressing applied in office which patient tolerated well.  Tetanus booster updated.  Patient without immunocompromise state: Antibiotics deferred at this time.  Return precautions discussed, patient verbalized understanding and is agreeable to plan. Final Clinical Impressions(s) / UC Diagnoses   Final diagnoses:  Abrasion of left leg, initial encounter     Discharge Instructions     Keep ear clean and dry. Important to practice RICE: Rest, ice, compress, elevate. Return for worsening pain, bleeding, thick/white or smelly discharge, fever.    ED Prescriptions    None     PDMP not reviewed this encounter.   Hall-Potvin, [place], [place] 05/13/19 2021

## 2019-05-13 NOTE — Discharge Instructions (Signed)
Keep ear clean and dry. Important to practice RICE: Rest, ice, compress, elevate. Return for worsening pain, bleeding, thick/white or smelly discharge, fever.

## 2019-09-01 ENCOUNTER — Encounter: Payer: Self-pay | Admitting: Family Medicine

## 2019-09-01 ENCOUNTER — Ambulatory Visit: Payer: BC Managed Care – PPO | Admitting: Family Medicine

## 2019-09-01 ENCOUNTER — Other Ambulatory Visit: Payer: Self-pay

## 2019-09-01 VITALS — BP 144/84 | HR 65 | Temp 97.8°F | Ht 73.25 in | Wt 182.0 lb

## 2019-09-01 DIAGNOSIS — M25572 Pain in left ankle and joints of left foot: Secondary | ICD-10-CM

## 2019-09-01 DIAGNOSIS — F17200 Nicotine dependence, unspecified, uncomplicated: Secondary | ICD-10-CM | POA: Diagnosis not present

## 2019-09-01 DIAGNOSIS — G8929 Other chronic pain: Secondary | ICD-10-CM

## 2019-09-01 DIAGNOSIS — I10 Essential (primary) hypertension: Secondary | ICD-10-CM | POA: Diagnosis not present

## 2019-09-01 NOTE — Assessment & Plan Note (Signed)
Suspect peroneal tendon strain. Home exercise routine. If no improvement can plan for PT referral.

## 2019-09-01 NOTE — Assessment & Plan Note (Signed)
BP elevated. Cont HCTZ. Home monitoring and call/mychart in 1-2 weeks with home numbers. If still elevated, will restart amlodipine.

## 2019-09-01 NOTE — Progress Notes (Signed)
Subjective:     Jordan Walker is a 34 y.o. male presenting for Establish Care (TOC from Luetta Nutting, MD. Patients last physical was in 2019. ), Hypertension (Patient has a history of HTN. Is currently taking amlodipine 2.5 mg tablet. Patient states that he has headaches from time to time and feels that an increase in pressure is the cause. ), and Ankle Pain (Patient states that he is suffering from bilateral ankle pain. Patient states that the left ankle hurts more than then right ankle. )     HPI  #Ankle pain - started 1.5 years ago - walking on concrete floor - lateral ankle pain - worse with inversion - worse at the end of the day - left > right - hx of sprained ankles when he was younger - no treatment currently - primarily treated with rest - is better in the morning and on days where he doesn't  - bought new shoes which improve comfort but no change to ankle pain  #HTN - does not check regularly at home - high when he has headaches - was on HCTZ and amlodpine but told to stop amlodipine - changed diet - not exercising  Review of Systems   Social History   Tobacco Use  Smoking Status Current Every Day Smoker  . Years: 14.00  . Types: Cigars  Smokeless Tobacco Never Used  Tobacco Comment   2 cigars per day        Objective:    BP Readings from Last 3 Encounters:  09/01/19 (!) 144/84  05/13/19 (!) 171/89  10/13/18 (!) 140/92   Wt Readings from Last 3 Encounters:  09/01/19 182 lb (82.6 kg)  10/13/18 172 lb (78 kg)  10/09/18 170 lb 12.8 oz (77.5 kg)    BP (!) 144/84 (BP Location: Left Arm, Patient Position: Sitting, Cuff Size: Large)   Pulse 65   Temp 97.8 F (36.6 C) (Oral)   Ht 6' 1.25" (1.861 m)   Wt 182 lb (82.6 kg)   SpO2 98%   BMI 23.85 kg/m    Physical Exam Constitutional:      Appearance: Normal appearance. He is not ill-appearing or diaphoretic.  HENT:     Right Ear: External ear normal.     Left Ear: External ear normal.   Nose: Nose normal.  Eyes:     General: No scleral icterus.    Extraocular Movements: Extraocular movements intact.     Conjunctiva/sclera: Conjunctivae normal.  Cardiovascular:     Rate and Rhythm: Normal rate and regular rhythm.     Heart sounds: No murmur.  Pulmonary:     Effort: Pulmonary effort is normal. No respiratory distress.     Breath sounds: Normal breath sounds. No wheezing.  Musculoskeletal:     Cervical back: Neck supple.     Comments: Left ankle: Inspection: swelling along the peroneal tendon Palpation: TTP along the peroneal tendon, no bony TTP ROM: normal with some pain with eversion/inversion Strength: normal, but pain with all strength testing Ligaments: normal  Skin:    General: Skin is warm and dry.  Neurological:     Mental Status: He is alert. Mental status is at baseline.  Psychiatric:        Mood and Affect: Mood normal.           Assessment & Plan:   Problem List Items Addressed This Visit      Cardiovascular and Mediastinum   Essential hypertension - Primary    BP  elevated. Cont HCTZ. Home monitoring and call/mychart in 1-2 weeks with home numbers. If still elevated, will restart amlodipine.       Relevant Medications   hydrochlorothiazide (HYDRODIURIL) 25 MG tablet     Other   Nicotine dependence with current use    Encouraged quitting his daily cigars due to elevated bp. Not interested at this time.       Left ankle pain    Suspect peroneal tendon strain. Home exercise routine. If no improvement can plan for PT referral.           Return in about 3 months (around 12/02/2019).  Lynnda Child, MD

## 2019-09-01 NOTE — Patient Instructions (Signed)
1) Check blood pressure 2) MyChart message in 2 weeks with your home readings 3) Continue the Hydrochlorothiazide     Your blood pressure high.   High blood pressure increases your risk for heart attack and stroke.    Please check your blood pressure 2-4 times a week.   To check your blood pressure 1) Sit in a quiet and relaxed place for 5 minutes 2) Make sure your feet are flat on the ground 3) Consider checking first thing in the morning   Normal blood pressure is less than 140/90 Ideally you blood pressure should be around 120/80  Other ways you can reduce your blood pressure:  1) Regular exercise -- Try to get 150 minutes (30 minutes, 5 days a week) of moderate to vigorous aerobic excercise -- Examples: brisk walking (2.5 miles per hour), water aerobics, dancing, gardening, tennis, biking slower than 10 miles per hour 2) DASH Diet - low fat meats, more fresh fruits and vegetables, whole grains, low salt 3) Quit smoking if you smoke 4) Loose 5-10% of your body weight

## 2019-09-01 NOTE — Assessment & Plan Note (Signed)
Encouraged quitting his daily cigars due to elevated bp. Not interested at this time.

## 2019-10-22 ENCOUNTER — Other Ambulatory Visit: Payer: Self-pay | Admitting: Family Medicine

## 2019-10-23 ENCOUNTER — Other Ambulatory Visit: Payer: Self-pay | Admitting: Family Medicine

## 2019-10-23 MED ORDER — HYDROCHLOROTHIAZIDE 25 MG PO TABS: 25.0000 mg | ORAL_TABLET | Freq: Every day | ORAL | 0 refills | Status: DC

## 2019-10-23 NOTE — Telephone Encounter (Signed)
Patient called.  Patient called [facility] for a refill on Hydrochlorothiazide 25 mg.  Patient was told his rx expired.  Patient's requesting a refill.

## 2019-10-23 NOTE — Telephone Encounter (Signed)
Dr. Selena Batten, this medication is showing on historical med list. Please advise on refill.

## 2020-01-23 ENCOUNTER — Other Ambulatory Visit: Payer: Self-pay | Admitting: Family Medicine

## 2020-02-02 ENCOUNTER — Other Ambulatory Visit: Payer: Self-pay

## 2020-02-04 NOTE — Telephone Encounter (Signed)
Left message for Jordan Walker to call the office to schedule follow up appointment with Dr. Selena Batten.  I advised him that Dr. Selena Batten had wanted to follow up with him in August.  Once appointment is scheduled, then refill enough to get him to that appointment.

## 2020-02-08 ENCOUNTER — Other Ambulatory Visit: Payer: Self-pay

## 2020-02-08 ENCOUNTER — Ambulatory Visit: Payer: BC Managed Care – PPO | Admitting: Family Medicine

## 2020-02-08 ENCOUNTER — Encounter: Payer: Self-pay | Admitting: Family Medicine

## 2020-02-08 VITALS — BP 140/80 | HR 102 | Temp 97.8°F | Wt 165.5 lb

## 2020-02-08 DIAGNOSIS — I10 Essential (primary) hypertension: Secondary | ICD-10-CM | POA: Diagnosis not present

## 2020-02-08 DIAGNOSIS — Z1159 Encounter for screening for other viral diseases: Secondary | ICD-10-CM

## 2020-02-08 DIAGNOSIS — Z0000 Encounter for general adult medical examination without abnormal findings: Secondary | ICD-10-CM | POA: Diagnosis not present

## 2020-02-08 DIAGNOSIS — Z114 Encounter for screening for human immunodeficiency virus [HIV]: Secondary | ICD-10-CM

## 2020-02-08 DIAGNOSIS — Z13220 Encounter for screening for lipoid disorders: Secondary | ICD-10-CM | POA: Diagnosis not present

## 2020-02-08 DIAGNOSIS — R634 Abnormal weight loss: Secondary | ICD-10-CM | POA: Insufficient documentation

## 2020-02-08 LAB — CBC WITH DIFFERENTIAL/PLATELET
Basophils Absolute: 0 10*3/uL (ref 0.0–0.1)
Basophils Relative: 0.5 % (ref 0.0–3.0)
Eosinophils Absolute: 0.2 10*3/uL (ref 0.0–0.7)
Eosinophils Relative: 3.2 % (ref 0.0–5.0)
HCT: 46.5 % (ref 39.0–52.0)
Hemoglobin: 15.8 g/dL (ref 13.0–17.0)
Lymphocytes Relative: 51.4 % — ABNORMAL HIGH (ref 12.0–46.0)
Lymphs Abs: 3.8 10*3/uL (ref 0.7–4.0)
MCHC: 33.9 g/dL (ref 30.0–36.0)
MCV: 83.8 fl (ref 78.0–100.0)
Monocytes Absolute: 0.5 10*3/uL (ref 0.1–1.0)
Monocytes Relative: 6.6 % (ref 3.0–12.0)
Neutro Abs: 2.9 10*3/uL (ref 1.4–7.7)
Neutrophils Relative %: 38.3 % — ABNORMAL LOW (ref 43.0–77.0)
Platelets: 191 10*3/uL (ref 150.0–400.0)
RBC: 5.55 Mil/uL (ref 4.22–5.81)
RDW: 14 % (ref 11.5–15.5)
WBC: 7.5 10*3/uL (ref 4.0–10.5)

## 2020-02-08 LAB — LIPID PANEL
Cholesterol: 131 mg/dL (ref 0–200)
HDL: 45.6 mg/dL (ref >39.00)
LDL Cholesterol: 48 mg/dL (ref 0–99)
NonHDL: 85.49
Total CHOL/HDL Ratio: 3
Triglycerides: 188 mg/dL — ABNORMAL HIGH (ref 0.0–149.0)
VLDL: 37.6 mg/dL (ref 0.0–40.0)

## 2020-02-08 LAB — COMPREHENSIVE METABOLIC PANEL
ALT: 15 U/L (ref 0–53)
AST: 18 U/L (ref 0–37)
Albumin: 4.6 g/dL (ref 3.5–5.2)
Alkaline Phosphatase: 76 U/L (ref 39–117)
BUN: 15 mg/dL (ref 6–23)
CO2: 27 mEq/L (ref 19–32)
Calcium: 9.9 mg/dL (ref 8.4–10.5)
Chloride: 102 mEq/L (ref 96–112)
Creatinine, Ser: 0.92 mg/dL (ref 0.40–1.50)
GFR: 106.25 mL/min (ref >60.00)
Glucose, Bld: 102 mg/dL — ABNORMAL HIGH (ref 70–99)
Potassium: 3.9 mEq/L (ref 3.5–5.1)
Sodium: 137 mEq/L (ref 135–145)
Total Bilirubin: 0.3 mg/dL (ref 0.2–1.2)
Total Protein: 7.5 g/dL (ref 6.0–8.3)

## 2020-02-08 LAB — TSH: TSH: 1.37 u[IU]/mL (ref 0.35–4.50)

## 2020-02-08 MED ORDER — HYDROCHLOROTHIAZIDE 25 MG PO TABS: 25.0000 mg | ORAL_TABLET | Freq: Every day | ORAL | 3 refills | Status: DC

## 2020-02-08 NOTE — Progress Notes (Signed)
Annual Exam   Chief Complaint:  Chief Complaint  Patient presents with  . Medication Refill    History of Present Illness:  Noralee Stainerry F Aslin is a 34 y.o. presents today for annual examination.    # HTN - checking at home with good control - taking medication   Nutrition/Lifestyle Diet: 2 meals per day Exercise: active job He is single partner, contraception - wife currently pregnant.  Any issues getting or maintaining an erection? no  Social History   Tobacco Use  Smoking Status Current Every Day Smoker  . Years: 14.00  . Types: Cigars  Smokeless Tobacco Never Used  Tobacco Comment   2 cigars per day   Social History   Substance and Sexual Activity  Alcohol Use Yes   Comment: Rarely, a couple of times a year   Social History   Substance and Sexual Activity  Drug Use Yes  . Frequency: 7.0 times per week  . Types: Marijuana   Comment: daily use     Safety The patient wears seatbelts: yes.     The patient feels safe at home and in their relationships: yes.  General Health Dentist in the last year: Yes Eye doctor: yes  Weight Wt Readings from Last 3 Encounters:  02/08/20 165 lb 8 oz (75.1 kg)  09/01/19 182 lb (82.6 kg)  10/13/18 172 lb (78 kg)   Patient has normal BMI  BMI Readings from Last 1 Encounters:  02/08/20 21.69 kg/m     Chronic disease screening Blood pressure monitoring:  BP Readings from Last 3 Encounters:  02/08/20 140/80  09/01/19 (!) 144/84  05/13/19 (!) 171/89    Lipid Monitoring: Indication for screening: age >35, obesity, diabetes, family hx, CV risk factors.  Lipid screening: Yes  Lab Results  Component Value Date   CHOL 158 10/13/2018   HDL 47 10/13/2018   LDLCALC 87 10/13/2018   TRIG 139 10/13/2018   CHOLHDL 3.4 10/13/2018     Diabetes Screening: age 24>40, overweight, family hx, PCOS, hx of gestational diabetes, at risk ethnicity, elevated blood pressure >135/80.  Diabetes Screening screening: Yes  Lab Results   Component Value Date   HGBA1C 5.5 04/17/2016     Immunization History  Administered Date(s) Administered  . Tdap 04/10/2016, 05/13/2019    Past Medical History:  Diagnosis Date  . Essential hypertension 07/29/2017  . Gastroesophageal reflux disease with esophagitis 11/06/2016  . GERD (gastroesophageal reflux disease)   . Hemorrhoids     Past Surgical History:  Procedure Laterality Date  . HEMORRHOID BANDING      Prior to Admission medications   Medication Sig Start Date End Date Taking? Authorizing Provider  hydrochlorothiazide (HYDRODIURIL) 25 MG tablet TAKE 1 TABLET BY MOUTH EVERY DAY 02/04/20  Yes Lynnda Childody, Matisha Termine R, MD    No Known Allergies   Social History   Socioeconomic History  . Marital status: Married    Spouse name: [place]Sierra  . Number of children: 5  . Years of education: associates degree  . Highest education level: Not on file  Occupational History  . Occupation: Camera operatorAssistant Associate  Tobacco Use  . Smoking status: Current Every Day Smoker    Years: 14.00    Types: Cigars  . Smokeless tobacco: Never Used  . Tobacco comment: 2 cigars per day  Vaping Use  . Vaping Use: Never used  Substance and Sexual Activity  . Alcohol use: Yes    Comment: Rarely, a couple of times a year  . Drug use:  Yes    Frequency: 7.0 times per week    Types: Marijuana    Comment: daily use  . Sexual activity: Yes    Comment: wife currently pregnant  Other Topics Concern  . Not on file  Social History Narrative   09/01/19   From: [place]   Living: with wife [place] (2012)   Work: [employer]      Family: 5 children Nizia (2006), Amia (2014), Jaylan (2013), Nyla (2019), one on the way      Enjoys: netflix      Exercise: active job - walking/lifting   Diet: breakfast, light lunch, dinner      Safety   Seat belts: Yes    Guns: No   Safe in relationships: Yes    Social Determinants of Corporate investment banker Strain:   . Difficulty of Paying  Living Expenses: Not on file  Food Insecurity:   . Worried About Programme researcher, broadcasting/film/video in the Last Year: Not on file  . Ran Out of Food in the Last Year: Not on file  Transportation Needs:   . Lack of Transportation (Medical): Not on file  . Lack of Transportation (Non-Medical): Not on file  Physical Activity:   . Days of Exercise per Week: Not on file  . Minutes of Exercise per Session: Not on file  Stress:   . Feeling of Stress : Not on file  Social Connections:   . Frequency of Communication with Friends and Family: Not on file  . Frequency of Social Gatherings with Friends and Family: Not on file  . Attends Religious Services: Not on file  . Active Member of Clubs or Organizations: Not on file  . Attends Banker Meetings: Not on file  . Marital Status: Not on file  Intimate Partner Violence:   . Fear of Current or Ex-Partner: Not on file  . Emotionally Abused: Not on file  . Physically Abused: Not on file  . Sexually Abused: Not on file    Family History  Problem Relation Age of Onset  . Diabetes Mother   . Alcohol abuse Father   . Hypertension Sister   . Hypertension Sister   . Hypertension Sister   . Hypertension Maternal Grandmother   . Heart attack Maternal Grandmother 78    Review of Systems  Constitutional: Positive for weight loss. Negative for chills, diaphoresis, fever and malaise/fatigue.  HENT: Negative for congestion and sore throat.   Eyes: Negative for blurred vision and double vision.  Respiratory: Negative for shortness of breath.   Cardiovascular: Negative for chest pain.  Gastrointestinal: Negative for heartburn, nausea and vomiting.  Genitourinary: Negative.   Musculoskeletal: Negative.  Negative for myalgias.  Skin: Negative for rash.  Neurological: Negative for dizziness and headaches.  Endo/Heme/Allergies: Does not bruise/bleed easily.  Psychiatric/Behavioral: Negative for depression. The patient is not nervous/anxious.       Physical Exam BP 140/80   Pulse (!) 102   Temp 97.8 F (36.6 C) (Temporal)   Wt 165 lb 8 oz (75.1 kg)   SpO2 100%   BMI 21.69 kg/m    BP Readings from Last 3 Encounters:  02/08/20 140/80  09/01/19 (!) 144/84  05/13/19 (!) 171/89      Physical Exam Constitutional:      General: He is not in acute distress.    Appearance: He is well-developed. He is not diaphoretic.  HENT:     Head: Normocephalic and atraumatic.  Right Ear: Tympanic membrane and ear canal normal.     Left Ear: Tympanic membrane and ear canal normal.     Nose: Nose normal.     Mouth/Throat:     Pharynx: Uvula midline.  Eyes:     General: No scleral icterus.    Conjunctiva/sclera: Conjunctivae normal.     Pupils: Pupils are equal, round, and reactive to light.  Cardiovascular:     Rate and Rhythm: Normal rate and regular rhythm.     Heart sounds: Normal heart sounds. No murmur heard.   Pulmonary:     Effort: Pulmonary effort is normal. No respiratory distress.     Breath sounds: Normal breath sounds. No wheezing.  Abdominal:     General: Bowel sounds are normal. There is no distension.     Palpations: Abdomen is soft. There is no mass.     Tenderness: There is no abdominal tenderness. There is no guarding.  Musculoskeletal:        General: Normal range of motion.     Cervical back: Normal range of motion and neck supple.  Lymphadenopathy:     Cervical: No cervical adenopathy.  Skin:    General: Skin is warm and dry.     Capillary Refill: Capillary refill takes less than 2 seconds.  Neurological:     General: No focal deficit present.     Mental Status: He is alert and oriented to person, place, and time.  Psychiatric:        Mood and Affect: Mood normal.        Behavior: Behavior normal.        Results:  PHQ-9:   Depression screen [facility] 2/9 09/01/2019 10/13/2018 10/09/2018  Decreased Interest 0 0 0  Down, Depressed, Hopeless 0 0 -  PHQ - 2 Score 0 0 0      Assessment: 34 y.o.  here for routine annual physical examination.  Plan: Problem List Items Addressed This Visit      Cardiovascular and Mediastinum   Essential hypertension    Reports good control at home. Cont HCTZ 25 mg. Labs today      Relevant Medications   hydrochlorothiazide (HYDRODIURIL) 25 MG tablet   Other Relevant Orders   Comprehensive metabolic panel     Other   Weight loss    Weight down 17 lbs in 5 months - unintentional. He does note he stopped eating lunch regularly, denies any other symptoms. Discussed getting some routine labs and advised calorie counting and adding back calories if low. F/u 3 months if continuing to lose weight      Relevant Orders   Comprehensive metabolic panel   CBC with Differential   TSH   Hepatitis C antibody   HIV Antibody (routine testing w rflx)    Other Visit Diagnoses    Annual physical exam    -  Primary   Relevant Medications   hydrochlorothiazide (HYDRODIURIL) 25 MG tablet   Screening for HIV (human immunodeficiency virus)       Relevant Orders   HIV Antibody (routine testing w rflx)   Encounter for hepatitis C screening test for low risk patient       Relevant Orders   Hepatitis C antibody   Need for hepatitis C screening test       Relevant Orders   Hepatitis C antibody   Encounter for screening for HIV       Relevant Orders   HIV Antibody (routine testing w rflx)   Screening  for lipid disorders       Relevant Orders   Lipid panel      Screening: -- Blood pressure screen normal -- cholesterol screening: will obtain -- Weight screening: weight loss see above -- Diabetes Screening: will obtain -- Nutrition: Encouraged healthy diet and exercise  The ASCVD Risk score Denman George DC Jr., et al., 2013) failed to calculate for the following reasons:   The 2013 ASCVD risk score is only valid for ages 29 to 43  -- Statin therapy for Age 84-75 with CVD risk >7.5%  Psych -- Depression screening (PHQ-9):   Depression screen [facility] 2/9  09/01/2019 10/13/2018 10/09/2018  Decreased Interest 0 0 0  Down, Depressed, Hopeless 0 0 -  PHQ - 2 Score 0 0 0      Safety -- tobacco screening: using: discussed quitting using the 5 A's -- alcohol screening:  low-risk usage. -- no evidence of domestic violence or intimate partner violence.   Cancer Screening -- No age related cancer screening due  Immunizations Immunization History  Administered Date(s) Administered  . Tdap 04/10/2016, 05/13/2019    -- flu vaccine declined -- TDAP q10 years up to date -- Covid-19 Vaccine declined   Encouraged regular vision and dental screening. Encouraged healthy exercise and diet.   Lynnda Child

## 2020-02-08 NOTE — Assessment & Plan Note (Signed)
Weight down 17 lbs in 5 months - unintentional. He does note he stopped eating lunch regularly, denies any other symptoms. Discussed getting some routine labs and advised calorie counting and adding back calories if low. F/u 3 months if continuing to lose weight

## 2020-02-08 NOTE — Assessment & Plan Note (Signed)
Reports good control at home. Cont HCTZ 25 mg. Labs today

## 2020-02-08 NOTE — Patient Instructions (Signed)
Count your calories over the next 1-2 weeks If getting 500 calories less than the recommended amount -- try adding calories  Follow-up in 3 months   Preventive Care 36-36 Years Old, Male Preventive care refers to lifestyle choices and visits with your health care provider that can promote health and wellness. This includes:  A yearly physical exam. This is also called an annual well check.  Regular dental and eye exams.  Immunizations.  Screening for certain conditions.  Healthy lifestyle choices, such as eating a healthy diet, getting regular exercise, not using drugs or products that contain nicotine and tobacco, and limiting alcohol use. What can I expect for my preventive care visit? Physical exam Your health care provider will check:  Height and weight. These may be used to calculate body mass index (BMI), which is a measurement that tells if you are at a healthy weight.  Heart rate and blood pressure.  Your skin for abnormal spots. Counseling Your health care provider may ask you questions about:  Alcohol, tobacco, and drug use.  Emotional well-being.  Home and relationship well-being.  Sexual activity.  Eating habits.  Work and work Statistician. What immunizations do I need?  Influenza (flu) vaccine  This is recommended every year. Tetanus, diphtheria, and pertussis (Tdap) vaccine  You may need a Td booster every 10 years. Varicella (chickenpox) vaccine  You may need this vaccine if you have not already been vaccinated. Human papillomavirus (HPV) vaccine  If recommended by your health care provider, you may need three doses over 6 months. Measles, mumps, and rubella (MMR) vaccine  You may need at least one dose of MMR. You may also need a second dose. Meningococcal conjugate (MenACWY) vaccine  One dose is recommended if you are 36-38 years old and a Market researcher living in a residence hall, or if you have one of several medical  conditions. You may also need additional booster doses. Pneumococcal conjugate (PCV13) vaccine  You may need this if you have certain conditions and were not previously vaccinated. Pneumococcal polysaccharide (PPSV23) vaccine  You may need one or two doses if you smoke cigarettes or if you have certain conditions. Hepatitis A vaccine  You may need this if you have certain conditions or if you travel or work in places where you may be exposed to hepatitis A. Hepatitis B vaccine  You may need this if you have certain conditions or if you travel or work in places where you may be exposed to hepatitis B. Haemophilus influenzae type b (Hib) vaccine  You may need this if you have certain risk factors. You may receive vaccines as individual doses or as more than one vaccine together in one shot (combination vaccines). Talk with your health care provider about the risks and benefits of combination vaccines. What tests do I need? Blood tests  Lipid and cholesterol levels. These may be checked every 5 years starting at age 13.  Hepatitis C test.  Hepatitis B test. Screening   Diabetes screening. This is done by checking your blood sugar (glucose) after you have not eaten for a while (fasting).  Sexually transmitted disease (STD) testing. Talk with your health care provider about your test results, treatment options, and if necessary, the need for more tests. Follow these instructions at home: Eating and drinking   Eat a diet that includes fresh fruits and vegetables, whole grains, lean protein, and low-fat dairy products.  Take vitamin and mineral supplements as recommended by your health care provider.  Do not drink alcohol if your health care provider tells you not to drink.  If you drink alcohol: ? Limit how much you have to 0-2 drinks a day. ? Be aware of how much alcohol is in your drink. In the [place] one drink equals one 12 oz bottle of beer (355 mL), one 5 oz glass of wine  (148 mL), or one 1 oz glass of hard liquor (44 mL). Lifestyle  Take daily care of your teeth and gums.  Stay active. Exercise for at least 30 minutes on 5 or more days each week.  Do not use any products that contain nicotine or tobacco, such as cigarettes, e-cigarettes, and chewing tobacco. If you need help quitting, ask your health care provider.  If you are sexually active, practice safe sex. Use a condom or other form of protection to prevent STIs (sexually transmitted infections). What's next?  Go to your health care provider once a year for a well check visit.  Ask your health care provider how often you should have your eyes and teeth checked.  Stay up to date on all vaccines. This information is not intended to replace advice given to you by your health care provider. Make sure you discuss any questions you have with your health care provider. Document Revised: 04/03/2018 Document Reviewed: 04/03/2018 Elsevier Patient Education  2020 [employer].

## 2020-02-09 LAB — HEPATITIS C ANTIBODY
Hepatitis C Ab: NONREACTIVE
SIGNAL TO CUT-OFF: 0.03 (ref <1.00)

## 2020-02-09 LAB — HIV ANTIBODY (ROUTINE TESTING W REFLEX): HIV 1&2 Ab, 4th Generation: NONREACTIVE

## 2020-05-11 ENCOUNTER — Ambulatory Visit: Payer: BC Managed Care – PPO | Admitting: Family Medicine

## 2021-04-10 ENCOUNTER — Telehealth: Payer: Self-pay | Admitting: Family Medicine

## 2021-04-10 DIAGNOSIS — Z0000 Encounter for general adult medical examination without abnormal findings: Secondary | ICD-10-CM

## 2021-04-13 NOTE — Telephone Encounter (Signed)
Pt scheduled a Lab/Cpe in February 2023 with Dr. Selena Batten pt stated he also need his medicine refilled again

## 2021-04-13 NOTE — Telephone Encounter (Signed)
Pt scheduled a Lab/Cpe in February 2023 with Dr. Selena Batten

## 2021-06-13 ENCOUNTER — Encounter: Payer: Self-pay | Admitting: Family Medicine

## 2021-06-13 ENCOUNTER — Other Ambulatory Visit: Payer: Self-pay

## 2021-06-13 ENCOUNTER — Ambulatory Visit (INDEPENDENT_AMBULATORY_CARE_PROVIDER_SITE_OTHER): Payer: BC Managed Care – PPO | Admitting: Family Medicine

## 2021-06-13 VITALS — BP 132/82 | HR 91 | Temp 98.1°F | Ht 73.0 in | Wt 143.2 lb

## 2021-06-13 DIAGNOSIS — N509 Disorder of male genital organs, unspecified: Secondary | ICD-10-CM

## 2021-06-13 DIAGNOSIS — R0789 Other chest pain: Secondary | ICD-10-CM | POA: Diagnosis not present

## 2021-06-13 DIAGNOSIS — R634 Abnormal weight loss: Secondary | ICD-10-CM | POA: Diagnosis not present

## 2021-06-13 DIAGNOSIS — Z113 Encounter for screening for infections with a predominantly sexual mode of transmission: Secondary | ICD-10-CM

## 2021-06-13 DIAGNOSIS — I10 Essential (primary) hypertension: Secondary | ICD-10-CM | POA: Diagnosis not present

## 2021-06-13 LAB — COMPREHENSIVE METABOLIC PANEL
ALT: 13 U/L (ref 0–53)
AST: 17 U/L (ref 0–37)
Albumin: 5.1 g/dL (ref 3.5–5.2)
Alkaline Phosphatase: 64 U/L (ref 39–117)
BUN: 18 mg/dL (ref 6–23)
CO2: 34 mEq/L — ABNORMAL HIGH (ref 19–32)
Calcium: 10.4 mg/dL (ref 8.4–10.5)
Chloride: 102 mEq/L (ref 96–112)
Creatinine, Ser: 1.04 mg/dL (ref 0.40–1.50)
GFR: 91.51 mL/min (ref >60.00)
Glucose, Bld: 78 mg/dL (ref 70–99)
Potassium: 4.5 mEq/L (ref 3.5–5.1)
Sodium: 139 mEq/L (ref 135–145)
Total Bilirubin: 0.6 mg/dL (ref 0.2–1.2)
Total Protein: 7.7 g/dL (ref 6.0–8.3)

## 2021-06-13 LAB — LIPID PANEL
Cholesterol: 154 mg/dL (ref 0–200)
HDL: 56.7 mg/dL (ref >39.00)
LDL Cholesterol: 87 mg/dL (ref 0–99)
NonHDL: 96.95
Total CHOL/HDL Ratio: 3
Triglycerides: 48 mg/dL (ref 0.0–149.0)
VLDL: 9.6 mg/dL (ref 0.0–40.0)

## 2021-06-13 LAB — CBC WITH DIFFERENTIAL/PLATELET
Basophils Absolute: 0 10*3/uL (ref 0.0–0.1)
Basophils Relative: 0.9 % (ref 0.0–3.0)
Eosinophils Absolute: 0.2 10*3/uL (ref 0.0–0.7)
Eosinophils Relative: 3.1 % (ref 0.0–5.0)
HCT: 46.8 % (ref 39.0–52.0)
Hemoglobin: 15.6 g/dL (ref 13.0–17.0)
Lymphocytes Relative: 47.9 % — ABNORMAL HIGH (ref 12.0–46.0)
Lymphs Abs: 2.5 10*3/uL (ref 0.7–4.0)
MCHC: 33.3 g/dL (ref 30.0–36.0)
MCV: 83.2 fl (ref 78.0–100.0)
Monocytes Absolute: 0.4 10*3/uL (ref 0.1–1.0)
Monocytes Relative: 8 % (ref 3.0–12.0)
Neutro Abs: 2.1 10*3/uL (ref 1.4–7.7)
Neutrophils Relative %: 40.1 % — ABNORMAL LOW (ref 43.0–77.0)
Platelets: 179 10*3/uL (ref 150.0–400.0)
RBC: 5.62 Mil/uL (ref 4.22–5.81)
RDW: 14.1 % (ref 11.5–15.5)
WBC: 5.2 10*3/uL (ref 4.0–10.5)

## 2021-06-13 LAB — TSH: TSH: 0.65 u[IU]/mL (ref 0.35–5.50)

## 2021-06-13 MED ORDER — HYDROCHLOROTHIAZIDE 25 MG PO TABS: 25.0000 mg | ORAL_TABLET | Freq: Every day | ORAL | 3 refills | Status: DC

## 2021-06-13 NOTE — Patient Instructions (Addendum)
EKG is normal - continue to monitor occasional chest tightness - labs today  Weight loss - labs today - if normal - will probably recommend some additional work-up or referral  STI  - screening  Skin lesion - steroid cream twice daily for 1 week - if no improvement and work-up negative will plan for dermatology referral - just update

## 2021-06-13 NOTE — Assessment & Plan Note (Signed)
Blood pressure at goal continue HCTZ 25 mg

## 2021-06-13 NOTE — Progress Notes (Signed)
Subjective:     Jordan Walker is a 36 y.o. male presenting for Chest Pain (On and off tightness that last around 20 mins. ), STD testing (Pt request), and Testicle Pain (Pt states there is a bump on back side that itches x 2 months )     Chest Pain  Pertinent negatives include no cough or shortness of breath.  Testicle Pain The patient's primary symptoms include testicular pain. Associated symptoms include chest pain. Pertinent negatives include no coughing or shortness of breath.   #STD testing - recently had oral intercourse w/ a new partner - posterior testicle with itching and small raised lesion - irritated - present for 2 months - has had other similar lesions that self resolve - will use antifungal/bacteria cream which typically works but this one has not resolved - no other pain or discharge - no urinary symptoms  #Chest pain - on and off - lasts about 20 minutes - did happen several years ago - when driving in the car it occurred - has happened 6 times a year for about 15 minutes - typically happens at rest - no sob  - sister has similar symptoms w/ anxiety - no anxiety - no reflux symptoms - improves w/ rest    Review of Systems  Respiratory:  Positive for chest tightness. Negative for cough and shortness of breath.   Cardiovascular:  Positive for chest pain.  Genitourinary:  Positive for testicular pain.    Social History   Tobacco Use  Smoking Status Every Day   Types: Cigars  Smokeless Tobacco Never  Tobacco Comments   2 cigars per day        Objective:    BP Readings from Last 3 Encounters:  06/13/21 132/82  02/08/20 140/80  09/01/19 (!) 144/84   Wt Readings from Last 3 Encounters:  06/13/21 143 lb 4 oz (65 kg)  02/08/20 165 lb 8 oz (75.1 kg)  09/01/19 182 lb (82.6 kg)    BP 132/82    Pulse 91    Temp 98.1 F (36.7 C) (Oral)    Ht 6\' 1"  (1.854 m)    Wt 143 lb 4 oz (65 kg)    SpO2 98%    BMI 18.90 kg/m    Physical Exam Exam  conducted with a chaperone present.  Constitutional:      Appearance: Normal appearance. He is not ill-appearing or diaphoretic.  HENT:     Right Ear: External ear normal.     Left Ear: External ear normal.     Nose: Nose normal.     Mouth/Throat:     Mouth: Mucous membranes are moist.  Eyes:     General: No scleral icterus.    Extraocular Movements: Extraocular movements intact.     Conjunctiva/sclera: Conjunctivae normal.  Cardiovascular:     Rate and Rhythm: Normal rate and regular rhythm.     Heart sounds: No murmur heard. Pulmonary:     Effort: Pulmonary effort is normal. No respiratory distress.     Breath sounds: Normal breath sounds. No wheezing.  Abdominal:     Hernia: A hernia is present. Hernia is present in the right inguinal area.  Genitourinary:    Penis: Circumcised.      Testes: Normal.     Comments: Small papular lesion on the perineum. No obvious erythema.   Musculoskeletal:     Cervical back: Neck supple.  Lymphadenopathy:     Cervical: No cervical adenopathy.  Lower Body: No right inguinal adenopathy. No left inguinal adenopathy.  Skin:    General: Skin is warm and dry.  Neurological:     Mental Status: He is alert. Mental status is at baseline.  Psychiatric:        Mood and Affect: Mood normal.    EKG: NSR, no ST changes, no T-wave abnormalities      Assessment & Plan:   Problem List Items Addressed This Visit       Cardiovascular and Mediastinum   Essential hypertension - Primary    Blood pressure at goal continue HCTZ 25 mg      Relevant Medications   hydrochlorothiazide (HYDRODIURIL) 25 MG tablet   Other Relevant Orders   Lipid panel   Comprehensive metabolic panel     Other   Unintentional weight loss of more than 10% body weight within 6 months    Patient has lost 40 pounds since May 2021.  He has lost 20 pounds since October 2021.  He does note stability in this and work-up was done in the past which overall was reassuring.   Discussed with additional 40 pound weight loss would repeat work-up and if no cause is found would recommend additional work-up including chest x-ray and possible referral.  Of note he did have some irregularities in his blood counts last time if this is persisting will refer to hematology.  Overall he notes feeling well and does endorse eating less and being more active in the last year he is only eating 2 meals per day.      Relevant Orders   TSH   CBC with Differential   HIV Antibody (routine testing w rflx)   Hepatitis C Antibody   Chest tightness    Blood pressure is at goal and EKG is reassuring, this happens about 6 times per year.  At this time we will get labs per weight loss and recommended continued monitoring at home.  Consider cardiology referral to rule out cardiac cause if persisting and no other source found      Relevant Orders   EKG 12-Lead (Completed)   TSH   Scrotal skin lesion    Pruritic skin lesion.  Does not seem consistent with sexually transmitted infection.  Advised course of steroids due to itch.  If no improvement after 1 week and no other source found on work-up today will refer to dermatology      Other Visit Diagnoses     Routine screening for STI (sexually transmitted infection)       Relevant Orders   HIV Antibody (routine testing w rflx)   RPR   Hepatitis C Antibody   C. trachomatis/N. gonorrhoeae RNA        Return if symptoms worsen or fail to improve.  Lynnda Child, MD  This visit occurred during the SARS-CoV-2 public health emergency.  Safety protocols were in place, including screening questions prior to the visit, additional usage of staff PPE, and extensive cleaning of exam room while observing appropriate contact time as indicated for disinfecting solutions.

## 2021-06-13 NOTE — Assessment & Plan Note (Signed)
Patient has lost 40 pounds since May 2021.  He has lost 20 pounds since October 2021.  He does note stability in this and work-up was done in the past which overall was reassuring.  Discussed with additional 40 pound weight loss would repeat work-up and if no cause is found would recommend additional work-up including chest x-ray and possible referral.  Of note he did have some irregularities in his blood counts last time if this is persisting will refer to hematology.  Overall he notes feeling well and does endorse eating less and being more active in the last year he is only eating 2 meals per day.

## 2021-06-13 NOTE — Assessment & Plan Note (Signed)
Pruritic skin lesion.  Does not seem consistent with sexually transmitted infection.  Advised course of steroids due to itch.  If no improvement after 1 week and no other source found on work-up today will refer to dermatology

## 2021-06-13 NOTE — Assessment & Plan Note (Signed)
Blood pressure is at goal and EKG is reassuring, this happens about 6 times per year.  At this time we will get labs per weight loss and recommended continued monitoring at home.  Consider cardiology referral to rule out cardiac cause if persisting and no other source found

## 2021-06-14 LAB — HEPATITIS C ANTIBODY
Hepatitis C Ab: NONREACTIVE
SIGNAL TO CUT-OFF: 0.12 (ref <1.00)

## 2021-06-14 LAB — RPR: RPR Ser Ql: NONREACTIVE

## 2021-06-14 LAB — HIV ANTIBODY (ROUTINE TESTING W REFLEX): HIV 1&2 Ab, 4th Generation: NONREACTIVE

## 2021-06-14 LAB — C. TRACHOMATIS/N. GONORRHOEAE RNA
C. trachomatis RNA, TMA: NOT DETECTED
N. gonorrhoeae RNA, TMA: NOT DETECTED

## 2021-06-15 ENCOUNTER — Telehealth: Payer: Self-pay | Admitting: Physician Assistant

## 2021-06-15 ENCOUNTER — Other Ambulatory Visit: Payer: Self-pay | Admitting: Family Medicine

## 2021-06-15 DIAGNOSIS — R634 Abnormal weight loss: Secondary | ICD-10-CM

## 2021-06-15 DIAGNOSIS — D729 Disorder of white blood cells, unspecified: Secondary | ICD-10-CM

## 2021-06-15 NOTE — Telephone Encounter (Signed)
Scheduled appt per 2/23 referral. Pt is aware of appt date and time. Pt is aware to arrive 15 mins prior to appt time and to bring and updated insurance card. Pt is aware of appt location.   °

## 2021-07-04 ENCOUNTER — Encounter: Payer: Self-pay | Admitting: Physician Assistant

## 2021-07-04 ENCOUNTER — Inpatient Hospital Stay: Payer: BC Managed Care – PPO | Attending: Physician Assistant | Admitting: Physician Assistant

## 2021-07-04 ENCOUNTER — Inpatient Hospital Stay: Payer: BC Managed Care – PPO

## 2021-07-04 ENCOUNTER — Other Ambulatory Visit: Payer: Self-pay

## 2021-07-04 ENCOUNTER — Telehealth: Payer: Self-pay | Admitting: Physician Assistant

## 2021-07-04 VITALS — BP 137/96 | HR 80 | Temp 97.9°F | Resp 16 | Wt 145.0 lb

## 2021-07-04 DIAGNOSIS — F1290 Cannabis use, unspecified, uncomplicated: Secondary | ICD-10-CM | POA: Diagnosis not present

## 2021-07-04 DIAGNOSIS — R799 Abnormal finding of blood chemistry, unspecified: Secondary | ICD-10-CM | POA: Diagnosis not present

## 2021-07-04 DIAGNOSIS — R630 Anorexia: Secondary | ICD-10-CM | POA: Diagnosis not present

## 2021-07-04 DIAGNOSIS — I10 Essential (primary) hypertension: Secondary | ICD-10-CM | POA: Diagnosis not present

## 2021-07-04 DIAGNOSIS — R634 Abnormal weight loss: Secondary | ICD-10-CM | POA: Diagnosis not present

## 2021-07-04 DIAGNOSIS — R7989 Other specified abnormal findings of blood chemistry: Secondary | ICD-10-CM | POA: Diagnosis not present

## 2021-07-04 LAB — CMP (CANCER CENTER ONLY)
ALT: 12 U/L (ref 0–44)
AST: 15 U/L (ref 15–41)
Albumin: 4.7 g/dL (ref 3.5–5.0)
Alkaline Phosphatase: 70 U/L (ref 38–126)
Anion gap: 8 (ref 5–15)
BUN: 20 mg/dL (ref 6–20)
CO2: 24 mmol/L (ref 22–32)
Calcium: 10 mg/dL (ref 8.9–10.3)
Chloride: 106 mmol/L (ref 98–111)
Creatinine: 0.92 mg/dL (ref 0.61–1.24)
GFR, Estimated: >60 mL/min (ref >60)
Glucose, Bld: 99 mg/dL (ref 70–99)
Potassium: 3.8 mmol/L (ref 3.5–5.1)
Sodium: 138 mmol/L (ref 135–145)
Total Bilirubin: 0.4 mg/dL (ref 0.3–1.2)
Total Protein: 7.7 g/dL (ref 6.5–8.1)

## 2021-07-04 LAB — CBC WITH DIFFERENTIAL (CANCER CENTER ONLY)
Abs Immature Granulocytes: 0.01 10*3/uL (ref 0.00–0.07)
Basophils Absolute: 0 10*3/uL (ref 0.0–0.1)
Basophils Relative: 1 %
Eosinophils Absolute: 0.1 10*3/uL (ref 0.0–0.5)
Eosinophils Relative: 2 %
HCT: 45.1 % (ref 39.0–52.0)
Hemoglobin: 15.6 g/dL (ref 13.0–17.0)
Immature Granulocytes: 0 %
Lymphocytes Relative: 42 %
Lymphs Abs: 2.4 10*3/uL (ref 0.7–4.0)
MCH: 28 pg (ref 26.0–34.0)
MCHC: 34.6 g/dL (ref 30.0–36.0)
MCV: 81 fL (ref 80.0–100.0)
Monocytes Absolute: 0.5 10*3/uL (ref 0.1–1.0)
Monocytes Relative: 8 %
Neutro Abs: 2.7 10*3/uL (ref 1.7–7.7)
Neutrophils Relative %: 47 %
Platelet Count: 191 10*3/uL (ref 150–400)
RBC: 5.57 MIL/uL (ref 4.22–5.81)
RDW: 13.9 % (ref 11.5–15.5)
WBC Count: 5.7 10*3/uL (ref 4.0–10.5)
nRBC: 0 % (ref 0.0–0.2)

## 2021-07-04 LAB — FOLATE: Folate: 5.4 ng/mL — ABNORMAL LOW (ref >5.9)

## 2021-07-04 LAB — HEPATITIS B SURFACE ANTIGEN: Hepatitis B Surface Ag: NONREACTIVE

## 2021-07-04 LAB — HEPATITIS B CORE ANTIBODY, TOTAL: Hep B Core Total Ab: NONREACTIVE

## 2021-07-04 LAB — HEPATITIS B SURFACE ANTIBODY,QUALITATIVE: Hep B S Ab: REACTIVE — AB

## 2021-07-04 LAB — VITAMIN B12: Vitamin B-12: 347 pg/mL (ref 180–914)

## 2021-07-04 MED ORDER — FOLIC ACID 1 MG PO TABS: 1.0000 mg | ORAL_TABLET | Freq: Every day | ORAL | 3 refills | Status: DC

## 2021-07-04 NOTE — Telephone Encounter (Signed)
I called Mr. Loveless to review the lab results from today. CBC was unremarkable without any WBC abnormalities. There is evidence of folate deficiency so I sent a prescription of folic acid 1 mg once a day. I am awaiting MMA levels so I will call patient back once that is finalized. He inquired about hepatitis B panel which shows no active infection.  ? ?Patient expressed understanding of the plan provided.  ?

## 2021-07-04 NOTE — Progress Notes (Signed)
?Jordan Walker ?[phone]   [phone] ? ?INITIAL CONSULT NOTE ? ?Patient Care Team: ?Lesleigh Noe, MD as PCP - General (Family Medicine) ? ?CHIEF COMPLAINTS/PURPOSE OF CONSULTATION:  ?Abnormal WBC and weight loss  ? ?HISTORY OF PRESENTING ILLNESS:  ?Jordan Walker 36 y.o. male with medical history significant for hypertension, GERD and hemorrhoids s/p banding. He is unaccompanied for this visit.  ? ?On review of the previous records, Jordan Walker was evaluated by his PCP, Dr. Waunita Walker, due to 40 lb weight loss since May 2021. Laboratory evaluation from 06/13/2021 showed decreased relative neutrophil percentage and increased relative lymphocyte percentage. HIV serology and hepatitis C serology were negative.  ? ?On exam today, Jordan Walker reports that he started to smoke marijuana during the day approximately two years ago. He smokes two marijuana-filled rolls per day during the weekdays and more on the weekends. Since he started to smoke marijuana, he admits to eating less and only eating one big meal in the evening time. Over the past month, he has intentionally increased his oral intake of food.  ? ?Otherwise, he reports feeling well without any no concerning symptoms. His energy levels are stable and he completes all his daily activities on his own. Additionally, he has an active job where he does a lot of walking and lifting.  He denies any nausea, vomiting or abdominal pain. His bowel habits are regular without any diarrhea or constipation. He denies easy bruising or signs of active bleeding. He denies any urinary symptoms. Patient reports occasional episodes of headaches secondary to elevated blood pressures. HE denies dizziness, vision changes or syncopal episodes. He denies fevers, chills, night sweats, shortness of breath, chest pain or cough. He has no other complaints. Rest of the 10 point ROS is below.  ? ?MEDICAL HISTORY:  ?Past Medical History:  ?Diagnosis Date  ?  Essential hypertension 07/29/2017  ? Gastroesophageal reflux disease with esophagitis 11/06/2016  ? GERD (gastroesophageal reflux disease)   ? Hemorrhoids   ? ? ?SURGICAL HISTORY: ?Past Surgical History:  ?Procedure Laterality Date  ? HEMORRHOID BANDING    ? ? ?SOCIAL HISTORY: ?Social History  ? ?Socioeconomic History  ? Marital status: Married  ?  Spouse name: [place]  ? Number of children: 5  ? Years of education: associates degree  ? Highest education level: Not on file  ?Occupational History  ? Occupation: Doctor, general practice  ?Tobacco Use  ? Smoking status: Every Day  ?  Types: Cigars  ? Smokeless tobacco: Never  ? Tobacco comments:  ?  2 cigars per day  ?Vaping Use  ? Vaping Use: Never used  ?Substance and Sexual Activity  ? Alcohol use: Yes  ?  Comment: Rarely, a couple of times a year  ? Drug use: Yes  ?  Frequency: 7.0 times per week  ?  Types: Marijuana  ?  Comment: daily use  ? Sexual activity: Yes  ?  Comment: wife currently pregnant  ?Other Topics Concern  ? Not on file  ?Social History Narrative  ? 09/01/19  ? From: [place]  ? Living: with wife [place] (2012)  ? 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   ? Family: 5 children Nizia (2006), Amia (2014), Jaylan (2013), Nyla (2019), one on the way  ?   ? Enjoys: netflix  ?   ? Exercise: active job - walking/lifting  ? Diet: breakfast, light lunch, dinner  ?   ? Safety  ? Seat belts: Yes   ?  Guns: No  ? Safe in relationships: Yes   ? ?Social Determinants of Health  ? ?Financial Resource Strain: Not on file  ?Food Insecurity: Not on file  ?Transportation Needs: Not on file  ?Physical Activity: Not on file  ?Stress: Not on file  ?Social Connections: Not on file  ?Intimate Partner Violence: Not on file  ? ? ?FAMILY HISTORY: ?Family History  ?Problem Relation Age of Onset  ? Diabetes Mother   ? Alcohol abuse Father   ? Hypertension Sister   ? Hypertension Sister   ? Hypertension Sister   ? Hypertension Maternal Grandmother   ? Heart attack Maternal Grandmother 78   ? ? ?ALLERGIES:  has No Known Allergies. ? ?MEDICATIONS:  ?Current Outpatient Medications  ?Medication Sig Dispense Refill  ? hydrochlorothiazide (HYDRODIURIL) 25 MG tablet Take 1 tablet (25 mg total) by mouth daily. Needs appt with PCP for further refills. 90 tablet 3  ? ?No current facility-administered medications for this visit.  ? ? ?REVIEW OF SYSTEMS:   ?Constitutional: ( - ) fevers, ( - )  chills , ( - ) night sweats ?Eyes: ( - ) blurriness of vision, ( - ) double vision, ( - ) watery eyes ?Ears, nose, mouth, throat, and face: ( - ) mucositis, ( - ) sore throat ?Respiratory: ( - ) cough, ( - ) dyspnea, ( - ) wheezes ?Cardiovascular: ( - ) palpitation, ( - ) chest discomfort, ( - ) lower extremity swelling ?Gastrointestinal:  ( - ) nausea, ( - ) heartburn, ( - ) change in bowel habits ?Skin: ( - ) abnormal skin rashes ?Lymphatics: ( - ) new lymphadenopathy, ( - ) easy bruising ?Neurological: ( - ) numbness, ( - ) tingling, ( - ) new weaknesses ?Behavioral/Psych: ( - ) mood change, ( - ) new changes  ?All other systems were reviewed with the patient and are negative. ? ?PHYSICAL EXAMINATION: ?ECOG PERFORMANCE STATUS: 0 - Asymptomatic ? ?Vitals:  ? 07/04/21 0918  ?BP: (!) 137/96  ?Pulse: 80  ?Resp: 16  ?Temp: 97.9 ?F (36.6 ?C)  ?SpO2: 100%  ? ?Filed Weights  ? 07/04/21 0918  ?Weight: 145 lb (65.8 kg)  ? ? ?GENERAL: well appearing male in NAD  ?SKIN: skin color, texture, turgor are normal, no rashes or significant lesions ?EYES: conjunctiva are pink and non-injected, sclera clear ?OROPHARYNX: no exudate, no erythema; lips, buccal mucosa, and tongue normal  ?NECK: supple, non-tender ?LYMPH:  no palpable lymphadenopathy in the cervical or supraclavicular lymph nodes.  ?LUNGS: clear to auscultation and percussion with normal breathing effort ?HEART: regular rate & rhythm and no murmurs and no lower extremity edema ?ABDOMEN: soft, non-tender, non-distended, normal bowel sounds ?Musculoskeletal: no cyanosis of digits  and no clubbing  ?PSYCH: alert & oriented x 3, fluent speech ?NEURO: no focal motor/sensory deficits ? ?LABORATORY DATA:  ?I have reviewed the data as listed ?CBC Latest Ref Rng & Units 07/04/2021 06/13/2021 02/08/2020  ?WBC 4.0 - 10.5 K/uL 5.7 5.2 7.5  ?Hemoglobin 13.0 - 17.0 g/dL 15.6 15.6 15.8  ?Hematocrit 39.0 - 52.0 % 45.1 46.8 46.5  ?Platelets 150 - 400 K/uL 191 179.0 191.0  ? ? ?CMP Latest Ref Rng & Units 06/13/2021 02/08/2020 10/13/2018  ?Glucose 70 - 99 mg/dL 78 102(H) 88  ?BUN 6 - 23 mg/dL 18 15 17   ?Creatinine 0.40 - 1.50 mg/dL 1.04 0.92 1.11  ?Sodium 135 - 145 mEq/L 139 137 138  ?Potassium 3.5 - 5.1 mEq/L 4.5 3.9 4.3  ?Chloride 96 -  112 mEq/L 102 102 103  ?CO2 19 - 32 mEq/L 34(H) 27 26  ?Calcium 8.4 - 10.5 mg/dL 10.4 9.9 10.1  ?Total Protein 6.0 - 8.3 g/dL 7.7 7.5 -  ?Total Bilirubin 0.2 - 1.2 mg/dL 0.6 0.3 -  ?Alkaline Phos 39 - 117 U/L 64 76 -  ?AST 0 - 37 U/L 17 18 -  ?ALT 0 - 53 U/L 13 15 -  ? ? ?ASSESSMENT & PLAN ?Jordan Walker is a 36 y.o. male who presents to the clinic for evaluation for abnormal WBC level and weight loss.  ? ?I reviewed the recent CBC panel from 06/13/2021 that shows mild decrease in relative neutrophil percentage and mild increase in relative lymphocyte percentage. The remaining CBC including WBC, Hgb and platelet count are all within normal limits which is very reassuring. ? ?We will proceed with serologic workup today to check CBC, CMP, nutritional deficiencies and hepatitis B serology.  ? ?#Abnormal differential on CBC: ?--Unknown etiology but likely benign  ?--Labs from 06/13/2021 was negative for HIV, hepatitis C and STDs (RPR, C.trachomatis and N.gonorrhoeae). ?--Labs today to check CBC, CMP, vitamin B12, methylmalonic acid, folate and hepatitis B serology ?--RTC based on above workup ? ?#Weight loss: ?--Likely secondary to smoking marijuana which affects his appetite and oral intake. Weight loss started when he began to smoke marijuana more frequently during the day.   ?--Advised to decrease smoking and be more intentional with eating 3 meals a day and supplement with snacks. ?--Patient will follow up with PCP to monitor his weight.  ? ? ?Orders Placed This Encounter  ?Procedures

## 2021-07-06 LAB — METHYLMALONIC ACID, SERUM: Methylmalonic Acid, Quantitative: 113 nmol/L (ref 0–378)

## 2021-07-10 ENCOUNTER — Telehealth: Payer: Self-pay | Admitting: Physician Assistant

## 2021-07-10 NOTE — Telephone Encounter (Signed)
Scheduled per 3/20 secure chat, pt has been called and confirmed appt ?

## 2021-12-30 ENCOUNTER — Other Ambulatory Visit: Payer: Self-pay | Admitting: Physician Assistant

## 2022-01-10 ENCOUNTER — Inpatient Hospital Stay: Payer: BC Managed Care – PPO

## 2022-01-10 ENCOUNTER — Inpatient Hospital Stay: Payer: BC Managed Care – PPO | Admitting: Physician Assistant

## 2022-01-15 ENCOUNTER — Other Ambulatory Visit: Payer: Self-pay | Admitting: Physician Assistant

## 2022-01-15 DIAGNOSIS — E538 Deficiency of other specified B group vitamins: Secondary | ICD-10-CM

## 2022-01-16 ENCOUNTER — Inpatient Hospital Stay: Payer: BC Managed Care – PPO | Admitting: Physician Assistant

## 2022-01-16 ENCOUNTER — Inpatient Hospital Stay: Payer: BC Managed Care – PPO | Attending: Family Medicine

## 2022-06-15 ENCOUNTER — Other Ambulatory Visit: Payer: Self-pay

## 2022-06-15 DIAGNOSIS — I10 Essential (primary) hypertension: Secondary | ICD-10-CM

## 2022-06-15 MED ORDER — HYDROCHLOROTHIAZIDE 25 MG PO TABS: 25.0000 mg | ORAL_TABLET | Freq: Every day | ORAL | 0 refills | Status: DC

## 2022-06-15 NOTE — Telephone Encounter (Signed)
Prescription Request  06/15/2022  Is this a "Controlled Substance" medicine? No  LOV: 06/13/2021 CPX with Dr Einar Pheasant  What is the name of the medication or equipment? HCTZ 25 mg  Have you contacted your pharmacy to request a refill? Yes   Which pharmacy would you like this sent to?   [id number] [address] [place] [address]Phone: [phone]: [phone]   Patient notified that their request is being sent to the clinical staff for review and that they should receive a response within 2 business days.   Please advise at  [facility]    HCTZ 25 mg # 90 x 3 on 06/13/2021. With note pt needs appt for further refills; pt has not scheduled TOC per appt notes. Sending note to PDutch QuintFNP.

## 2023-02-14 DIAGNOSIS — D649 Anemia, unspecified: Secondary | ICD-10-CM | POA: Insufficient documentation

## 2023-02-14 LAB — BASIC METABOLIC PANEL
BUN: 13 (ref 4–21)
CO2: 22 (ref 13–22)
Chloride: 100 (ref 99–108)
Creatinine: 1 (ref 0.6–1.3)
Glucose: 90
Potassium: 4.1 meq/L (ref 3.5–5.1)
Sodium: 141 (ref 137–147)

## 2023-02-14 LAB — CBC: RBC: 5.98 — AB (ref 3.87–5.11)

## 2023-02-14 LAB — LIPID PANEL
Cholesterol: 156 (ref 0–200)
HDL: 52 (ref 35–70)
LDL Cholesterol: 89
LDl/HDL Ratio: 3
Triglycerides: 76 (ref 40–160)

## 2023-02-14 LAB — HEPATIC FUNCTION PANEL
ALT: 16 U/L (ref 10–40)
AST: 21 (ref 14–40)
Alkaline Phosphatase: 82 (ref 25–125)
Bilirubin, Total: 0.3

## 2023-02-14 LAB — CBC AND DIFFERENTIAL
HCT: 51 (ref 41–53)
Hemoglobin: 16.8 (ref 13.5–17.5)
Neutrophils Absolute: 5.4
Platelets: 204 10*3/uL (ref 150–400)
WBC: 8.5

## 2023-02-14 LAB — VITAMIN B12: Vitamin B-12: 652

## 2023-02-14 LAB — COMPREHENSIVE METABOLIC PANEL
Albumin: 5.2 — AB (ref 3.5–5.0)
Calcium: 10.2 (ref 8.7–10.7)
Globulin: 2.9
eGFR: 102

## 2023-02-14 LAB — IRON,TIBC AND FERRITIN PANEL
%SAT: 9
Iron: 35
UIBC: 334

## 2023-02-14 LAB — VITAMIN D 25 HYDROXY (VIT D DEFICIENCY, FRACTURES): Vit D, 25-Hydroxy: 19.1

## 2023-02-14 LAB — TSH: TSH: 0.74 (ref 0.41–5.90)

## 2023-03-23 ENCOUNTER — Other Ambulatory Visit: Payer: Self-pay | Admitting: Physician Assistant

## 2023-03-25 NOTE — Telephone Encounter (Signed)
Need appt or f/up with PCP

## 2023-04-14 ENCOUNTER — Other Ambulatory Visit: Payer: Self-pay | Admitting: Family

## 2023-04-14 DIAGNOSIS — I10 Essential (primary) hypertension: Secondary | ICD-10-CM

## 2023-05-08 ENCOUNTER — Other Ambulatory Visit (HOSPITAL_COMMUNITY)
Admission: RE | Admit: 2023-05-08 | Discharge: 2023-05-08 | Disposition: A | Discharge status: Home / Self Care | Payer: BC Managed Care – PPO | Source: Ambulatory Visit | Attending: Nurse Practitioner | Admitting: Nurse Practitioner

## 2023-05-08 ENCOUNTER — Encounter: Payer: Self-pay | Admitting: Nurse Practitioner

## 2023-05-08 ENCOUNTER — Ambulatory Visit: Payer: BC Managed Care – PPO | Admitting: Nurse Practitioner

## 2023-05-08 VITALS — BP 148/90 | HR 79 | Temp 98.4°F | Ht 73.0 in | Wt 161.6 lb

## 2023-05-08 DIAGNOSIS — E538 Deficiency of other specified B group vitamins: Secondary | ICD-10-CM

## 2023-05-08 DIAGNOSIS — E559 Vitamin D deficiency, unspecified: Secondary | ICD-10-CM | POA: Diagnosis not present

## 2023-05-08 DIAGNOSIS — Z0000 Encounter for general adult medical examination without abnormal findings: Secondary | ICD-10-CM | POA: Diagnosis not present

## 2023-05-08 DIAGNOSIS — Z13220 Encounter for screening for lipoid disorders: Secondary | ICD-10-CM

## 2023-05-08 DIAGNOSIS — Z720 Tobacco use: Secondary | ICD-10-CM | POA: Diagnosis not present

## 2023-05-08 DIAGNOSIS — R7989 Other specified abnormal findings of blood chemistry: Secondary | ICD-10-CM

## 2023-05-08 DIAGNOSIS — Z113 Encounter for screening for infections with a predominantly sexual mode of transmission: Secondary | ICD-10-CM | POA: Insufficient documentation

## 2023-05-08 DIAGNOSIS — I10 Essential (primary) hypertension: Secondary | ICD-10-CM | POA: Diagnosis not present

## 2023-05-08 DIAGNOSIS — N509 Disorder of male genital organs, unspecified: Secondary | ICD-10-CM

## 2023-05-08 LAB — IBC + FERRITIN
Ferritin: 151.2 ng/mL (ref 22.0–322.0)
Iron: 65 ug/dL (ref 42–165)
Saturation Ratios: 15.7 % — ABNORMAL LOW (ref 20.0–50.0)
TIBC: 414.4 ug/dL (ref 250.0–450.0)
Transferrin: 296 mg/dL (ref 212.0–360.0)

## 2023-05-08 LAB — CBC
HCT: 47.4 % (ref 39.0–52.0)
Hemoglobin: 15.7 g/dL (ref 13.0–17.0)
MCHC: 33.2 g/dL (ref 30.0–36.0)
MCV: 85.7 fL (ref 78.0–100.0)
Platelets: 183 10*3/uL (ref 150.0–400.0)
RBC: 5.53 Mil/uL (ref 4.22–5.81)
RDW: 14.3 % (ref 11.5–15.5)
WBC: 7.2 10*3/uL (ref 4.0–10.5)

## 2023-05-08 LAB — COMPREHENSIVE METABOLIC PANEL
ALT: 16 U/L (ref 0–53)
AST: 19 U/L (ref 0–37)
Albumin: 4.8 g/dL (ref 3.5–5.2)
Alkaline Phosphatase: 72 U/L (ref 39–117)
BUN: 17 mg/dL (ref 6–23)
CO2: 30 meq/L (ref 19–32)
Calcium: 9.8 mg/dL (ref 8.4–10.5)
Chloride: 104 meq/L (ref 96–112)
Creatinine, Ser: 0.94 mg/dL (ref 0.40–1.50)
GFR: 101.95 mL/min (ref >60.00)
Glucose, Bld: 93 mg/dL (ref 70–99)
Potassium: 4.6 meq/L (ref 3.5–5.1)
Sodium: 140 meq/L (ref 135–145)
Total Bilirubin: 0.3 mg/dL (ref 0.2–1.2)
Total Protein: 7.3 g/dL (ref 6.0–8.3)

## 2023-05-08 LAB — URINALYSIS, MICROSCOPIC ONLY

## 2023-05-08 LAB — LIPID PANEL
Cholesterol: 153 mg/dL (ref 0–200)
HDL: 52.3 mg/dL (ref >39.00)
LDL Cholesterol: 88 mg/dL (ref 0–99)
NonHDL: 100.31
Total CHOL/HDL Ratio: 3
Triglycerides: 60 mg/dL (ref 0.0–149.0)
VLDL: 12 mg/dL (ref 0.0–40.0)

## 2023-05-08 LAB — VITAMIN D 25 HYDROXY (VIT D DEFICIENCY, FRACTURES): VITD: 25.1 ng/mL — ABNORMAL LOW (ref 30.00–100.00)

## 2023-05-08 LAB — FOLATE: Folate: 9.3 ng/mL (ref >5.9)

## 2023-05-08 LAB — TSH: TSH: 0.86 u[IU]/mL (ref 0.35–5.50)

## 2023-05-08 LAB — VITAMIN B12: Vitamin B-12: 396 pg/mL (ref 211–911)

## 2023-05-08 MED ORDER — AMLODIPINE BESYLATE 5 MG PO TABS: 5.0000 mg | ORAL_TABLET | Freq: Every day | ORAL | 0 refills | Status: DC

## 2023-05-08 MED ORDER — METHYLPREDNISOLONE ACETATE 80 MG/ML IJ SUSP
80.0000 mg | Freq: Once | INTRAMUSCULAR | Status: AC
  Administered 2023-05-08: 80 mg via INTRAMUSCULAR

## 2023-05-08 NOTE — Assessment & Plan Note (Signed)
 History of same has been seen by hematology in the past was on folate replacement.  Pending CBC, iron, ferritin, folate, B12 today.

## 2023-05-08 NOTE — Assessment & Plan Note (Signed)
.    Discussed age-appropriate immunizations and screening exams.  Did review patient's personal, surgical, social, family histories.  Patient is up-to-date on all age-appropriate vaccinations he would like.  He refused the flu vaccine today.  Patient is too young for CRC screening or prostate cancer screening.  Patient was given information at discharge about preventative healthcare maintenance with anticipatory guidance.  We will do routine screening for STIs at patient's request today.  Patient currently asymptomatic.

## 2023-05-08 NOTE — Assessment & Plan Note (Signed)
 History of cigar use pending urine microscopy to rule out microscopic hematuria

## 2023-05-08 NOTE — Assessment & Plan Note (Signed)
 Administer Depo-Medrol  80 mg in office.  Ambulatory referral to dermatology for further evaluation.  Do not think this is sexually related rash

## 2023-05-08 NOTE — Progress Notes (Signed)
 Established Patient Office Visit  Subjective   Patient ID: Jordan Walker, male    DOB: Feb 07, 1986  Age: 38 y.o. MRN: [id number]  Chief Complaint  Patient presents with   Transitions Of Care    Sti testing. Pt complains of bumps on inside of thigh and scrotum. States that bumps are itchy. On and off for a couple years.     HPI  Transfer of care: patient was last seen by [place] cody on 06/13/2021  HTN: states that he has been on hydrochlorothiazide . States that he has been on it regular for a month or two. Does not check blood pressure  Scrotal lesion: Has been evaluated for this in the past.  He does not recall doing steroids in the past.  States is intermittent in nature.  States he was told it was "hair bumps".  Patient does not think his hair bumps at all  for complete physical and follow up of chronic conditions.  Immunizations: -Tetanus: Completed in 2021 -Influenza: refused  -Shingles: Too young -Pneumonia: Too young  Diet: Fair diet. He is eating once a work and then once after work with a snack. He will drink soda. Some water  Exercise: No regular exercise. Employment   Eye exam:  PRN  Dental exam: Completes semi-annually    Colonoscopy: Young, currently average risk Lung Cancer Screening: N/A  PSA: Too young, currently average risk  Sleep: gets up around 4am and will go to bed around 1030. Does feels rested sometimes.   Scrotal lesion: has been present for years. States that it is in the inguinal area that is intermitten and itch. States no discharge. Has tried alcohol that did not work.       Review of Systems  Constitutional:  Negative for chills and fever.  Respiratory:  Negative for shortness of breath.   Cardiovascular:  Negative for chest pain and leg swelling.  Gastrointestinal:  Negative for abdominal pain, blood in stool, constipation, diarrhea, nausea and vomiting.       BM daily   Genitourinary:  Negative for dysuria and hematuria.   Neurological:  Negative for tingling and headaches.  Psychiatric/Behavioral:  Negative for hallucinations and suicidal ideas.       Objective:     BP (!) 148/90   Pulse 79   Temp 98.4 F (36.9 C) (Oral)   Ht 6\' 1"  (1.854 m)   Wt 161 lb 9.6 oz (73.3 kg)   SpO2 98%   BMI 21.32 kg/m  BP Readings from Last 3 Encounters:  05/08/23 (!) 148/90  07/04/21 (!) 137/96  06/13/21 132/82   Wt Readings from Last 3 Encounters:  05/08/23 161 lb 9.6 oz (73.3 kg)  07/04/21 145 lb (65.8 kg)  06/13/21 143 lb 4 oz (65 kg)   SpO2 Readings from Last 3 Encounters:  05/08/23 98%  07/04/21 100%  06/13/21 98%      Physical Exam Vitals and nursing note reviewed. Exam conducted with a chaperone present Eulas Hick, CMA).  Constitutional:      Appearance: Normal appearance.  HENT:     Right Ear: Tympanic membrane, ear canal and external ear normal.     Left Ear: Tympanic membrane, ear canal and external ear normal.     Mouth/Throat:     Mouth: Mucous membranes are moist.     Pharynx: Oropharynx is clear.  Eyes:     Extraocular Movements: Extraocular movements intact.     Pupils: Pupils are equal, round, and reactive to light.  Cardiovascular:     Rate and Rhythm: Normal rate and regular rhythm.     Pulses: Normal pulses.     Heart sounds: Normal heart sounds.  Pulmonary:     Effort: Pulmonary effort is normal.     Breath sounds: Normal breath sounds.  Abdominal:     General: Bowel sounds are normal. There is no distension.     Palpations: There is no mass.     Tenderness: There is no abdominal tenderness.     Hernia: No hernia is present.  Musculoskeletal:     Right lower leg: No edema.     Left lower leg: No edema.  Lymphadenopathy:     Cervical: No cervical adenopathy.  Skin:    General: Skin is warm.          Comments: Darkened ciruclar lesion that are macular in nature   Neurological:     General: No focal deficit present.     Mental Status: He is alert.     Deep  Tendon Reflexes:     Reflex Scores:      Bicep reflexes are 2+ on the right side and 2+ on the left side.      Patellar reflexes are 2+ on the right side and 2+ on the left side.    Comments: Bilateral upper and lower extremity strength 5/5  Psychiatric:        Mood and Affect: Mood normal.        Behavior: Behavior normal.        Thought Content: Thought content normal.        Judgment: Judgment normal.      No results found for any visits on 05/08/23.    The ASCVD Risk score (Arnett DK, et al., 2019) failed to calculate for the following reasons:   The 2019 ASCVD risk score is only valid for ages 84 to 57    Assessment & Plan:   Problem List Items Addressed This Visit       Cardiovascular and Mediastinum   Essential hypertension   Patient currently maintained on hydrochlorothiazide  25 mg daily.  Blood pressure not controlled.  Add on amlodipine  5 mg daily. Follow up in 1 month       Relevant Medications   amLODipine  (NORVASC ) 5 MG tablet   Other Relevant Orders   CBC   Comprehensive metabolic panel   TSH     Other   Preventative health care - Primary   .  Discussed age-appropriate immunizations and screening exams.  Did review patient's personal, surgical, social, family histories.  Patient is up-to-date on all age-appropriate vaccinations he would like.  He refused the flu vaccine today.  Patient is too young for CRC screening or prostate cancer screening.  Patient was given information at discharge about preventative healthcare maintenance with anticipatory guidance.  We will do routine screening for STIs at patient's request today.  Patient currently asymptomatic.      Scrotal skin lesion   Administer Depo-Medrol  80 mg in office.  Ambulatory referral to dermatology for further evaluation.  Do not think this is sexually related rash      Relevant Orders   Ambulatory referral to Dermatology   Abnormal CBC   History of same has been seen by hematology in the past  was on folate replacement.  Pending CBC, iron, ferritin, folate, B12 today.      Relevant Orders   CBC   Vitamin B12   IBC + Ferritin   Tobacco  use   History of cigar use pending urine microscopy to rule out microscopic hematuria      Relevant Orders   Lipid panel   Urine Microscopic   Vitamin D  deficiency   History of the same pending vitamin D  level today      Relevant Orders   VITAMIN D  25 Hydroxy (Vit-D Deficiency, Fractures)   Folic acid  deficiency   Relevant Orders   Folate   Other Visit Diagnoses       Routine screening for STI (sexually transmitted infection)       Relevant Orders   HSV(herpes simplex vrs) 1+2 ab-IgG   RPR   HIV Antibody (routine testing w rflx)   Urine cytology ancillary only     Screening for lipid disorders           Return in about 4 weeks (around 06/05/2023) for BP recheck.    Margarie Shay, NP

## 2023-05-08 NOTE — Patient Instructions (Addendum)
 Nice to see you today I will refer you to dermatology We gave you a shot of steroid in office today Follow up with me in 1 month for a blood pressure recheck

## 2023-05-08 NOTE — Assessment & Plan Note (Signed)
 Patient currently maintained on hydrochlorothiazide  25 mg daily.  Blood pressure not controlled.  Add on amlodipine  5 mg daily. Follow up in 1 month

## 2023-05-08 NOTE — Assessment & Plan Note (Signed)
History of the same pending vitamin D level today

## 2023-05-09 LAB — URINE CYTOLOGY ANCILLARY ONLY
Chlamydia: NEGATIVE
Comment: NEGATIVE
Comment: NEGATIVE
Comment: NORMAL
Neisseria Gonorrhea: NEGATIVE
Trichomonas: NEGATIVE

## 2023-05-09 LAB — HIV ANTIBODY (ROUTINE TESTING W REFLEX): HIV 1&2 Ab, 4th Generation: NONREACTIVE

## 2023-05-09 LAB — HSV(HERPES SIMPLEX VRS) I + II AB-IGG
HSV 1 IGG,TYPE SPECIFIC AB: >58 {index} — ABNORMAL HIGH
HSV 2 IGG,TYPE SPECIFIC AB: <0.9 {index}

## 2023-05-09 LAB — RPR: RPR Ser Ql: NONREACTIVE

## 2023-05-10 ENCOUNTER — Encounter: Payer: Self-pay | Admitting: Nurse Practitioner

## 2023-05-10 DIAGNOSIS — N509 Disorder of male genital organs, unspecified: Secondary | ICD-10-CM

## 2023-05-10 NOTE — Telephone Encounter (Signed)
Can we see what questions the patient has please

## 2023-05-13 MED ORDER — LEVOCETIRIZINE DIHYDROCHLORIDE 5 MG PO TABS: 5.0000 mg | ORAL_TABLET | Freq: Every evening | ORAL | 0 refills | Status: DC

## 2023-05-23 ENCOUNTER — Encounter: Payer: Self-pay | Admitting: *Deleted

## 2023-06-10 ENCOUNTER — Ambulatory Visit: Payer: BC Managed Care – PPO | Admitting: Nurse Practitioner

## 2023-06-11 ENCOUNTER — Encounter: Payer: Self-pay | Admitting: Nurse Practitioner

## 2023-06-12 ENCOUNTER — Other Ambulatory Visit: Payer: Self-pay | Admitting: Nurse Practitioner

## 2023-06-12 DIAGNOSIS — N509 Disorder of male genital organs, unspecified: Secondary | ICD-10-CM

## 2023-06-14 ENCOUNTER — Other Ambulatory Visit: Payer: Self-pay | Admitting: Physician Assistant

## 2023-06-18 DIAGNOSIS — B081 Molluscum contagiosum: Secondary | ICD-10-CM | POA: Diagnosis not present

## 2023-06-18 DIAGNOSIS — L538 Other specified erythematous conditions: Secondary | ICD-10-CM | POA: Diagnosis not present

## 2023-06-18 DIAGNOSIS — R238 Other skin changes: Secondary | ICD-10-CM | POA: Diagnosis not present

## 2023-06-18 DIAGNOSIS — L309 Dermatitis, unspecified: Secondary | ICD-10-CM | POA: Diagnosis not present

## 2023-06-18 DIAGNOSIS — L810 Postinflammatory hyperpigmentation: Secondary | ICD-10-CM | POA: Diagnosis not present

## 2023-06-18 DIAGNOSIS — L280 Lichen simplex chronicus: Secondary | ICD-10-CM | POA: Diagnosis not present

## 2023-06-18 DIAGNOSIS — B353 Tinea pedis: Secondary | ICD-10-CM | POA: Diagnosis not present

## 2023-07-07 ENCOUNTER — Other Ambulatory Visit: Payer: Self-pay | Admitting: Nurse Practitioner

## 2023-07-07 DIAGNOSIS — I10 Essential (primary) hypertension: Secondary | ICD-10-CM

## 2023-07-08 MED ORDER — AMLODIPINE BESYLATE 5 MG PO TABS: 5.0000 mg | ORAL_TABLET | Freq: Every day | ORAL | 0 refills | Status: DC

## 2023-07-08 NOTE — Telephone Encounter (Signed)
 Patient needs a BP follow up with me in the next 30 days

## 2023-07-08 NOTE — Telephone Encounter (Signed)
 LVM to schedule

## 2023-09-19 ENCOUNTER — Other Ambulatory Visit: Payer: Self-pay | Admitting: Physician Assistant

## 2023-09-19 DIAGNOSIS — D294 Benign neoplasm of scrotum: Secondary | ICD-10-CM | POA: Diagnosis not present

## 2023-09-19 DIAGNOSIS — L309 Dermatitis, unspecified: Secondary | ICD-10-CM | POA: Diagnosis not present

## 2023-09-19 DIAGNOSIS — L538 Other specified erythematous conditions: Secondary | ICD-10-CM | POA: Diagnosis not present

## 2023-09-19 DIAGNOSIS — B081 Molluscum contagiosum: Secondary | ICD-10-CM | POA: Diagnosis not present

## 2023-09-19 DIAGNOSIS — L280 Lichen simplex chronicus: Secondary | ICD-10-CM | POA: Diagnosis not present

## 2023-09-19 NOTE — Telephone Encounter (Signed)
 Pt needs to schedule an appt or f/up with his PCP

## 2023-12-03 ENCOUNTER — Other Ambulatory Visit: Payer: Self-pay | Admitting: Nurse Practitioner

## 2023-12-03 DIAGNOSIS — I10 Essential (primary) hypertension: Secondary | ICD-10-CM

## 2023-12-03 NOTE — Telephone Encounter (Signed)
 Patient never followed up as directed. Needs 30 day follow up for BP if he wants to continue getting refills

## 2023-12-03 NOTE — Telephone Encounter (Signed)
 Spoke to pt, pt stated he would have to call office back to sch appt. Pt is aware he needs an appt within next 30 days for future refills.

## 2024-01-15 ENCOUNTER — Ambulatory Visit (INDEPENDENT_AMBULATORY_CARE_PROVIDER_SITE_OTHER): Payer: Self-pay | Admitting: Nurse Practitioner

## 2024-01-15 ENCOUNTER — Encounter: Payer: Self-pay | Admitting: Nurse Practitioner

## 2024-01-15 VITALS — BP 158/90 | HR 71 | Temp 98.2°F | Ht 73.0 in | Wt 172.6 lb

## 2024-01-15 DIAGNOSIS — N529 Male erectile dysfunction, unspecified: Secondary | ICD-10-CM | POA: Insufficient documentation

## 2024-01-15 DIAGNOSIS — I10 Essential (primary) hypertension: Secondary | ICD-10-CM

## 2024-01-15 DIAGNOSIS — R519 Headache, unspecified: Secondary | ICD-10-CM | POA: Insufficient documentation

## 2024-01-15 MED ORDER — SILDENAFIL CITRATE 50 MG PO TABS: 25.0000 mg | ORAL_TABLET | Freq: Every day | ORAL | 0 refills | Status: DC | PRN

## 2024-01-15 MED ORDER — AMLODIPINE BESYLATE 5 MG PO TABS: 5.0000 mg | ORAL_TABLET | Freq: Every day | ORAL | 0 refills | Status: AC

## 2024-01-15 NOTE — Assessment & Plan Note (Signed)
 Difficulty with obtaining erection.  Sometimes difficulty maintaining erection.  Will do sildenafil  25 to 50 mg daily as needed sexual intercourse.  Also discussed the importance of getting blood pressure under control to help with this

## 2024-01-15 NOTE — Assessment & Plan Note (Signed)
 Could be related to blood pressure but likely due to lifestyle poor hydration.  Encouraged 64 ounces of water daily

## 2024-01-15 NOTE — Assessment & Plan Note (Signed)
 Blood pressure elevated today upon initial and recheck patient has been off antihypertensives restart amlodipine  5 mg daily stressed the importance of following up in office while on medication

## 2024-01-15 NOTE — Patient Instructions (Addendum)
 Nice to see you today I want you to try and get 64oz of water in a day and see if that helps with the headaches Take your blood pressure medication daily Follow up with me in 6 weeks for a recheck on your blood pressure

## 2024-01-15 NOTE — Progress Notes (Signed)
 Established Patient Office Visit  Subjective   Patient ID: Jordan Walker, male    DOB: 07-Jan-1986  Age: 38 y.o. MRN: [id number]  Chief Complaint  Patient presents with   Annual Exam    HPI  HTN: states that he was seen by me in January patient was placed on amlodipine  5 mg.  He was lost to follow-up.  He has been off medication for couple weeks  Headaches: states that he is having headaches once a week. States that it is on the left side of the head. States that it is intermittnet with a dull ache. States that he can take tylenol and ibuprofen that helps. States he does not have an aura. States tha the is getting 5-6 hours and feels rested. Does not drink much water   ED: states that he has been having issues with an erectoin for the past 6 months.  States difficulty getting erection.  States it was like it taking much longer than what it usually would.  States even sometimes when he gets erection that he will lose it.    Review of Systems  Constitutional:  Negative for chills and fever.  Respiratory:  Negative for shortness of breath.   Cardiovascular:  Negative for chest pain.  Neurological:  Positive for headaches. Negative for dizziness.      Objective:     BP (!) 158/90   Pulse 71   Temp 98.2 F (36.8 C) (Oral)   Ht 6' 1 (1.854 m)   Wt 172 lb 9.6 oz (78.3 kg)   SpO2 100%   BMI 22.77 kg/m  BP Readings from Last 3 Encounters:  01/15/24 (!) 158/90  05/08/23 (!) 148/90  07/04/21 (!) 137/96   Wt Readings from Last 3 Encounters:  01/15/24 172 lb 9.6 oz (78.3 kg)  05/08/23 161 lb 9.6 oz (73.3 kg)  07/04/21 145 lb (65.8 kg)   SpO2 Readings from Last 3 Encounters:  01/15/24 100%  05/08/23 98%  07/04/21 100%      Physical Exam Vitals and nursing note reviewed.  Constitutional:      Appearance: Normal appearance.  Cardiovascular:     Rate and Rhythm: Normal rate and regular rhythm.     Heart sounds: Normal heart sounds.  Pulmonary:     Effort: Pulmonary  effort is normal.     Breath sounds: Normal breath sounds.  Neurological:     Mental Status: He is alert.      No results found for any visits on 01/15/24.    The 10-year ASCVD risk score (Arnett DK, et al., 2019) is: 11.7%    Assessment & Plan:   Problem List Items Addressed This Visit       Cardiovascular and Mediastinum   Essential hypertension - Primary   Blood pressure elevated today upon initial and recheck patient has been off antihypertensives restart amlodipine  5 mg daily stressed the importance of following up in office while on medication      Relevant Medications   amLODipine  (NORVASC ) 5 MG tablet   sildenafil  (VIAGRA ) 50 MG tablet     Other   Erectile dysfunction   Difficulty with obtaining erection.  Sometimes difficulty maintaining erection.  Will do sildenafil  25 to 50 mg daily as needed sexual intercourse.  Also discussed the importance of getting blood pressure under control to help with this      Relevant Medications   sildenafil  (VIAGRA ) 50 MG tablet   Acute nonintractable headache   Could be related  to blood pressure but likely due to lifestyle poor hydration.  Encouraged 64 ounces of water daily      Relevant Medications   amLODipine  (NORVASC ) 5 MG tablet    Return in about 6 weeks (around 02/26/2024) for BP recheck.    Adina Crandall, NP

## 2024-01-16 ENCOUNTER — Telehealth: Payer: Self-pay

## 2024-01-16 ENCOUNTER — Other Ambulatory Visit (HOSPITAL_COMMUNITY): Payer: Self-pay

## 2024-01-16 NOTE — Telephone Encounter (Signed)
 Pharmacy Patient Advocate Encounter   Received notification from Onbase that prior authorization for Sildenafil  citrate 50 is required/requested.   Insurance verification completed.   The patient is insured through [employer] .   Per test claim: PA required; PA submitted to above mentioned insurance via Latent Key/confirmation #/EOC [id number] Status is pending

## 2024-01-21 ENCOUNTER — Other Ambulatory Visit (HOSPITAL_COMMUNITY): Payer: Self-pay

## 2024-01-21 NOTE — Telephone Encounter (Signed)
 Pharmacy Patient Advocate Encounter  Received notification from [employer] that Prior Authorization for Sildenafil  citrate 50 tabs has been partially approved for 6 tabs/ 30 days from 01/21/24-01/20/25. Patient copay is $2.80   PA #/Case ID/Reference #: [id number]

## 2024-01-30 DIAGNOSIS — L280 Lichen simplex chronicus: Secondary | ICD-10-CM | POA: Diagnosis not present

## 2024-02-04 ENCOUNTER — Telehealth: Payer: Self-pay | Admitting: Nurse Practitioner

## 2024-02-04 NOTE — Telephone Encounter (Signed)
 Got notice that his insurance did not cover the sildenafil . He can try and use an Good Rx card if he has not already gotten the prescription

## 2024-02-18 ENCOUNTER — Other Ambulatory Visit: Payer: Self-pay | Admitting: Nurse Practitioner

## 2024-02-18 DIAGNOSIS — N529 Male erectile dysfunction, unspecified: Secondary | ICD-10-CM

## 2024-02-26 ENCOUNTER — Ambulatory Visit: Payer: Self-pay | Admitting: Nurse Practitioner

## 2024-04-10 ENCOUNTER — Other Ambulatory Visit: Payer: Self-pay | Admitting: Nurse Practitioner

## 2024-04-10 DIAGNOSIS — I10 Essential (primary) hypertension: Secondary | ICD-10-CM

## 2024-04-10 NOTE — Telephone Encounter (Signed)
Called and schedule pt

## 2024-04-10 NOTE — Telephone Encounter (Signed)
 Patient is overdue for a blood pressure follow up. Please schedule in the next 30 days

## 2024-05-16 ENCOUNTER — Encounter: Payer: Self-pay | Admitting: Emergency Medicine

## 2024-05-16 ENCOUNTER — Ambulatory Visit: Admission: EM | Admit: 2024-05-16 | Discharge: 2024-05-16 | Disposition: A | Discharge status: Home / Self Care

## 2024-05-16 ENCOUNTER — Ambulatory Visit

## 2024-05-16 DIAGNOSIS — M79644 Pain in right finger(s): Secondary | ICD-10-CM | POA: Diagnosis not present

## 2024-05-16 DIAGNOSIS — I10 Essential (primary) hypertension: Secondary | ICD-10-CM

## 2024-05-16 NOTE — ED Provider Notes (Signed)
 " Jordan Walker    CSN: [id number] Arrival date & time: 05/16/24  1255      History   Chief Complaint Chief Complaint  Patient presents with   Finger Injury    R hand 5th finger     HPI Jordan Walker is a 39 y.o. male.  Patient presents with 5-day history of pain and swelling in his right fifth finger.  No known specific injury.  No wounds, redness, bruising, numbness, weakness.  No treatments at home.  The history is provided by the patient and medical records.    Past Medical History:  Diagnosis Date   Essential hypertension 07/29/2017   Gastroesophageal reflux disease with esophagitis 11/06/2016   GERD (gastroesophageal reflux disease)    Hemorrhoids     Patient Active Problem List   Diagnosis Date Noted   Erectile dysfunction 01/15/2024   Acute nonintractable headache 01/15/2024   Tobacco use 05/08/2023   Vitamin D  deficiency 05/08/2023   Folic acid  deficiency 05/08/2023   Anemia 02/14/2023   Abnormal CBC 07/04/2021   Weight loss 07/04/2021   Chest tightness 06/13/2021   Scrotal skin lesion 06/13/2021   Unintentional weight loss of more than 10% body weight within 6 months 02/08/2020   Left ankle pain 09/01/2019   Nicotine dependence with current use 08/06/2017   Essential hypertension 07/29/2017   Preventative health care 04/10/2016    Past Surgical History:  Procedure Laterality Date   HEMORRHOID BANDING         Home Medications    Prior to Admission medications  Medication Sig Start Date End Date Taking? Authorizing Provider  levocetirizine (XYZAL ) 5 MG tablet SMARTSIG:1 Tablet(s) By Mouth Every Evening 02/18/24  Yes [provider]  tacrolimus (PROTOPIC) 0.1 % ointment Apply topically daily as needed. 02/01/24  Yes [provider]  amLODipine  (NORVASC ) 5 MG tablet TAKE 1 TABLET (5 MG TOTAL) BY MOUTH DAILY. 04/10/24   Wendee Lynwood HERO, NP  desonide (DESOWEN) 0.05 % ointment Apply topically 2 (two) times daily. 09/23/23    [provider]  folic acid  (FOLVITE ) 1 MG tablet TAKE 1 TABLET BY MOUTH EVERY DAY 12/31/21   Neomi Lis T, PA-C  sildenafil  (VIAGRA ) 50 MG tablet TAKE 0.5-1 TABLETS (25-50 MG TOTAL) BY MOUTH DAILY AS NEEDED FOR ERECTILE DYSFUNCTION. 02/19/24   Wendee Lynwood HERO, NP  Vitamin D , Ergocalciferol , (DRISDOL) 1.25 MG (50000 UNIT) CAPS capsule Take 50,000 Units by mouth once a week. 03/19/23   [provider]    Family History Family History  Problem Relation Age of Onset   Diabetes Mother    Alcohol abuse Father    Hypertension Sister    Hypertension Sister    Hypertension Sister    Hypertension Maternal Grandmother    Heart attack Maternal Grandmother 44    Social History Social History[1]   Allergies   Patient has no known allergies.   Review of Systems Review of Systems  Musculoskeletal:  Positive for arthralgias and joint swelling.  Skin:  Negative for color change, rash and wound.  Neurological:  Negative for weakness and numbness.     Physical Exam Triage Vital Signs ED Triage Vitals  Encounter Vitals Group     BP 05/16/24 1308 (!) 148/96     Girls Systolic BP Percentile --      Girls Diastolic BP Percentile --      Boys Systolic BP Percentile --      Boys Diastolic BP Percentile --  Pulse Rate 05/16/24 1308 91     Resp 05/16/24 1308 18     Temp 05/16/24 1308 98.7 F (37.1 C)     Temp Source 05/16/24 1308 Oral     SpO2 05/16/24 1308 98 %     Weight 05/16/24 1307 172 lb 9.9 oz (78.3 kg)     Height --      Head Circumference --      Peak Flow --      Pain Score 05/16/24 1307 6     Pain Loc --      Pain Education --      Exclude from Growth Chart --    No data found.  Updated Vital Signs BP 135/89 (BP Location: Right Arm)   Pulse 91   Temp 98.7 F (37.1 C) (Oral)   Resp 18   Wt 172 lb 9.9 oz (78.3 kg)   SpO2 98%   BMI 22.77 kg/m   Visual Acuity Right Eye Distance:   Left Eye Distance:   Bilateral Distance:    Right Eye  Near:   Left Eye Near:    Bilateral Near:     Physical Exam Constitutional:      General: He is not in acute distress. HENT:     Mouth/Throat:     Mouth: Mucous membranes are moist.  Cardiovascular:     Rate and Rhythm: Normal rate.  Pulmonary:     Effort: Pulmonary effort is normal. No respiratory distress.  Musculoskeletal:        General: Swelling and tenderness present. No deformity. Normal range of motion.     Comments: Mild tenderness and moderate edema of right fifth finger from PIP to base.  No wounds, ecchymosis, erythema.  Strength 5/5, sensation intact, brisk capillary refill.  Skin:    Capillary Refill: Capillary refill takes less than 2 seconds.     Findings: No bruising, erythema, lesion or rash.  Neurological:     General: No focal deficit present.     Mental Status: He is alert.     Sensory: No sensory deficit.     Motor: No weakness.      UC Treatments / Results  Labs (all labs ordered are listed, but only abnormal results are displayed) Labs Reviewed - No data to display  EKG   Radiology DG Finger Little Right Result Date: 05/16/2024 EXAM: 3 VIEW(S) XRAY OF THE FINGER(S) 05/16/2024 01:26:50 PM COMPARISON: None available. CLINICAL HISTORY: Injury and pain. FINDINGS: BONES AND JOINTS: No acute fracture. No malalignment. SOFT TISSUES: Soft tissue swelling of the 5th digit. IMPRESSION: 1. Soft tissue swelling of the 5th digit. 2. No acute osseous findings. Electronically signed by: Waddell Calk MD 05/16/2024 02:10 PM EST RP Workstation: [id number]    Procedures Procedures (including critical care time)  Medications Ordered in UC Medications - No data to display  Initial Impression / Assessment and Plan / UC Course  I have reviewed the triage vital signs and the nursing notes.  Pertinent labs & imaging results that were available during my care of the patient were reviewed by me and considered in my medical decision making (see chart for details).    Right little finger pain and swelling; elevated blood pressure reading with hypertension.   Xray of finger pending.  Treating today with finger splint and buddy taping.  Discussed rest, elevation, ice packs, ibuprofen.  Education provided on finger fracture as patient is being discharged prior to radiologist read of x-ray.  Instructed patient to follow-up  with an orthopedist.  Contact information for on-call Ortho provided.  Also discussed with patient that his blood pressure is elevated today and needs to be rechecked by his PCP.  Education provided on managing hypertension.  He agrees to plan of care.   X-ray of finger negative.  Discussed x-ray result with patient via telephone.  Instructed him to continue symptomatic treatment as previously discussed and to follow-up with orthopedics if his symptoms are not improving.  He agrees to plan of care.   Final Clinical Impressions(s) / UC Diagnoses   Final diagnoses:  Pain of finger of right hand  Elevated blood pressure reading in office with diagnosis of hypertension     Discharge Instructions      Wear the finger splint and keep your fingers buddy taped as directed.  Take ibuprofen as directed.  Keep your hand elevated to reduce swelling as directed.  Apply ice packs as directed.  Follow-up with an orthopedist such as the one listed below.    Your blood pressure is elevated today at 148/96.  Please have this rechecked by your primary care provider in 2-4 weeks.           ED Prescriptions   None    PDMP not reviewed this encounter.     [1]  Social History Tobacco Use   Smoking status: Every Day    Types: Cigars    Passive exposure: Current   Smokeless tobacco: Never   Tobacco comments:    2 cigars per day (black and mild)   Vaping Use   Vaping status: Never Used  Substance Use Topics   Alcohol use: Not Currently    Comment: Rarely, a couple of times a year   Drug use: Yes    Frequency: 7.0 times per week    Types:  Marijuana    Comment: daily use     Joliene Salvador H, NP 05/16/24 1425  "

## 2024-05-16 NOTE — Discharge Instructions (Addendum)
 Wear the finger splint and keep your fingers buddy taped as directed.  Take ibuprofen as directed.  Keep your hand elevated to reduce swelling as directed.  Apply ice packs as directed.  Follow-up with an orthopedist such as the one listed below.    Your blood pressure is elevated today at 148/96.  Please have this rechecked by your primary care provider in 2-4 weeks.

## 2024-05-16 NOTE — ED Triage Notes (Signed)
 Pt presents c/o r hand finger injury x 5 days. Pt states,  I don't know if I jammed my pinky or what but it hurts and it's swollen.  Pt denies numbness or tingling in the finger.

## 2024-06-03 ENCOUNTER — Ambulatory Visit: Admitting: Nurse Practitioner
